# Patient Record
Sex: Female | Born: 1965
Health system: Southern US, Community
[De-identification: ages and names within clinical notes are randomized; demographics above are authoritative.]

## PROBLEM LIST (undated history)

## (undated) DIAGNOSIS — F329 Major depressive disorder, single episode, unspecified: Secondary | ICD-10-CM

## (undated) DIAGNOSIS — K219 Gastro-esophageal reflux disease without esophagitis: Secondary | ICD-10-CM

## (undated) DIAGNOSIS — I10 Essential (primary) hypertension: Secondary | ICD-10-CM

## (undated) DIAGNOSIS — F32A Depression, unspecified: Secondary | ICD-10-CM

## (undated) DIAGNOSIS — E669 Obesity, unspecified: Secondary | ICD-10-CM

## (undated) DIAGNOSIS — G4730 Sleep apnea, unspecified: Secondary | ICD-10-CM

## (undated) DIAGNOSIS — K5900 Constipation, unspecified: Secondary | ICD-10-CM

## (undated) DIAGNOSIS — F419 Anxiety disorder, unspecified: Secondary | ICD-10-CM

## (undated) HISTORY — DX: Anxiety disorder, unspecified: F41.9

## (undated) HISTORY — DX: Major depressive disorder, single episode, unspecified: F32.9

## (undated) HISTORY — DX: Constipation, unspecified: K59.00

## (undated) HISTORY — DX: Depression, unspecified: F32.A

## (undated) HISTORY — DX: Gastro-esophageal reflux disease without esophagitis: K21.9

## (undated) HISTORY — DX: Obesity, unspecified: E66.9

---

## 2000-04-03 ENCOUNTER — Ambulatory Visit (HOSPITAL_COMMUNITY): Admission: RE | Admit: 2000-04-03 | Discharge: 2000-04-03 | Payer: Self-pay | Admitting: Family Medicine

## 2000-10-11 ENCOUNTER — Ambulatory Visit (HOSPITAL_COMMUNITY): Admission: RE | Admit: 2000-10-11 | Discharge: 2000-10-11 | Payer: Self-pay | Admitting: Internal Medicine

## 2000-10-11 ENCOUNTER — Encounter: Payer: Self-pay | Admitting: Internal Medicine

## 2000-10-23 ENCOUNTER — Ambulatory Visit (HOSPITAL_COMMUNITY): Admission: RE | Admit: 2000-10-23 | Discharge: 2000-10-23 | Payer: Self-pay | Admitting: Internal Medicine

## 2001-04-16 HISTORY — PX: ABDOMINAL HYSTERECTOMY: SHX81

## 2002-01-11 ENCOUNTER — Encounter: Payer: Self-pay | Admitting: Emergency Medicine

## 2002-01-11 ENCOUNTER — Encounter: Payer: Self-pay | Admitting: General Surgery

## 2002-01-11 ENCOUNTER — Inpatient Hospital Stay (HOSPITAL_COMMUNITY): Admission: EM | Admit: 2002-01-11 | Discharge: 2002-01-15 | Payer: Self-pay | Admitting: Emergency Medicine

## 2002-01-12 ENCOUNTER — Encounter: Payer: Self-pay | Admitting: General Surgery

## 2002-03-22 ENCOUNTER — Emergency Department (HOSPITAL_COMMUNITY): Admission: EM | Admit: 2002-03-22 | Discharge: 2002-03-22 | Payer: Self-pay | Admitting: Internal Medicine

## 2005-11-12 ENCOUNTER — Inpatient Hospital Stay (HOSPITAL_COMMUNITY): Admission: EM | Admit: 2005-11-12 | Discharge: 2005-11-14 | Payer: Self-pay | Admitting: Emergency Medicine

## 2005-11-12 ENCOUNTER — Ambulatory Visit: Payer: Self-pay | Admitting: Gastroenterology

## 2005-12-27 ENCOUNTER — Ambulatory Visit: Payer: Self-pay | Admitting: Internal Medicine

## 2006-01-16 ENCOUNTER — Ambulatory Visit: Payer: Self-pay | Admitting: Internal Medicine

## 2006-01-16 ENCOUNTER — Ambulatory Visit (HOSPITAL_COMMUNITY): Admission: RE | Admit: 2006-01-16 | Discharge: 2006-01-16 | Payer: Self-pay | Admitting: Internal Medicine

## 2006-01-16 HISTORY — PX: COLONOSCOPY: SHX174

## 2006-03-14 ENCOUNTER — Emergency Department (HOSPITAL_COMMUNITY): Admission: EM | Admit: 2006-03-14 | Discharge: 2006-03-14 | Payer: Self-pay | Admitting: Emergency Medicine

## 2006-12-07 ENCOUNTER — Emergency Department (HOSPITAL_COMMUNITY): Admission: EM | Admit: 2006-12-07 | Discharge: 2006-12-08 | Payer: Self-pay | Admitting: Emergency Medicine

## 2006-12-18 ENCOUNTER — Ambulatory Visit (HOSPITAL_BASED_OUTPATIENT_CLINIC_OR_DEPARTMENT_OTHER): Admission: RE | Admit: 2006-12-18 | Discharge: 2006-12-18 | Payer: Self-pay | Admitting: Otolaryngology

## 2007-03-26 ENCOUNTER — Emergency Department (HOSPITAL_COMMUNITY): Admission: EM | Admit: 2007-03-26 | Discharge: 2007-03-26 | Payer: Self-pay | Admitting: Family Medicine

## 2009-02-27 ENCOUNTER — Emergency Department (HOSPITAL_COMMUNITY): Admission: EM | Admit: 2009-02-27 | Discharge: 2009-02-27 | Payer: Self-pay | Admitting: Emergency Medicine

## 2009-08-19 ENCOUNTER — Emergency Department (HOSPITAL_COMMUNITY): Admission: EM | Admit: 2009-08-19 | Discharge: 2009-08-19 | Payer: Self-pay | Admitting: Emergency Medicine

## 2009-10-10 ENCOUNTER — Observation Stay (HOSPITAL_COMMUNITY): Admission: EM | Admit: 2009-10-10 | Discharge: 2009-10-11 | Payer: Self-pay | Admitting: Emergency Medicine

## 2009-10-13 ENCOUNTER — Emergency Department (HOSPITAL_COMMUNITY): Admission: EM | Admit: 2009-10-13 | Discharge: 2009-10-13 | Payer: Self-pay | Admitting: Family Medicine

## 2010-07-02 LAB — URINALYSIS, ROUTINE W REFLEX MICROSCOPIC
Glucose, UA: NEGATIVE mg/dL
Nitrite: NEGATIVE
Protein, ur: NEGATIVE mg/dL
Specific Gravity, Urine: 1.03 (ref 1.005–1.030)
Urobilinogen, UA: 1 mg/dL (ref 0.0–1.0)
pH: 5.5 (ref 5.0–8.0)

## 2010-07-02 LAB — CBC
Hemoglobin: 12.5 g/dL (ref 12.0–15.0)
MCH: 33.3 pg (ref 26.0–34.0)
MCHC: 33.6 g/dL (ref 30.0–36.0)
MCV: 99.3 fL (ref 78.0–100.0)
Platelets: 259 10*3/uL (ref 150–400)
RBC: 3.75 MIL/uL — ABNORMAL LOW (ref 3.87–5.11)
RDW: 13.9 % (ref 11.5–15.5)

## 2010-07-02 LAB — DIFFERENTIAL
Basophils Absolute: 0 10*3/uL (ref 0.0–0.1)
Eosinophils Relative: 2 % (ref 0–5)
Lymphs Abs: 2.6 10*3/uL (ref 0.7–4.0)
Neutrophils Relative %: 60 % (ref 43–77)

## 2010-07-04 LAB — RAPID STREP SCREEN (MED CTR MEBANE ONLY): Streptococcus, Group A Screen (Direct): NEGATIVE

## 2010-07-04 LAB — URINE MICROSCOPIC-ADD ON

## 2010-07-04 LAB — URINALYSIS, ROUTINE W REFLEX MICROSCOPIC
Glucose, UA: NEGATIVE mg/dL
Ketones, ur: 15 mg/dL — AB
Leukocytes, UA: NEGATIVE
Nitrite: POSITIVE — AB
Specific Gravity, Urine: 1.023 (ref 1.005–1.030)
Urobilinogen, UA: 1 mg/dL (ref 0.0–1.0)

## 2010-07-04 LAB — POCT I-STAT, CHEM 8
Calcium, Ion: 1.12 mmol/L (ref 1.12–1.32)
Creatinine, Ser: 0.8 mg/dL (ref 0.4–1.2)
Hemoglobin: 16.3 g/dL — ABNORMAL HIGH (ref 12.0–15.0)

## 2010-07-04 LAB — MONONUCLEOSIS SCREEN: Mono Screen: NEGATIVE

## 2010-07-19 ENCOUNTER — Emergency Department (HOSPITAL_COMMUNITY): Payer: 59

## 2010-07-19 ENCOUNTER — Emergency Department (HOSPITAL_COMMUNITY)
Admission: EM | Admit: 2010-07-19 | Discharge: 2010-07-20 | Disposition: A | Discharge status: Home / Self Care | Payer: 59 | Attending: Emergency Medicine | Admitting: Emergency Medicine

## 2010-07-19 DIAGNOSIS — B9789 Other viral agents as the cause of diseases classified elsewhere: Secondary | ICD-10-CM | POA: Insufficient documentation

## 2010-07-19 DIAGNOSIS — R509 Fever, unspecified: Secondary | ICD-10-CM | POA: Insufficient documentation

## 2010-07-19 DIAGNOSIS — R05 Cough: Secondary | ICD-10-CM | POA: Insufficient documentation

## 2010-07-19 DIAGNOSIS — R059 Cough, unspecified: Secondary | ICD-10-CM | POA: Insufficient documentation

## 2010-07-19 DIAGNOSIS — J029 Acute pharyngitis, unspecified: Secondary | ICD-10-CM | POA: Insufficient documentation

## 2010-07-19 DIAGNOSIS — E860 Dehydration: Secondary | ICD-10-CM | POA: Insufficient documentation

## 2010-07-19 DIAGNOSIS — I10 Essential (primary) hypertension: Secondary | ICD-10-CM | POA: Insufficient documentation

## 2010-07-19 LAB — URINE MICROSCOPIC-ADD ON

## 2010-07-19 LAB — URINALYSIS, ROUTINE W REFLEX MICROSCOPIC
Glucose, UA: NEGATIVE mg/dL
Specific Gravity, Urine: 1.024 (ref 1.005–1.030)
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
## 2010-09-01 NOTE — Discharge Summary (Signed)
NAME:  Leslie Rodriguez, Leslie Rodriguez                           ACCOUNT NO.:  [id number]   MEDICAL RECORD NO.:  [id number]                  PATIENT TYPE:   LOCATION:                                       FACILITY:   PHYSICIAN:  Dirk Dress. Katrinka Blazing, M.D.                DATE OF BIRTH:   DATE OF ADMISSION:  01/11/2002  DATE OF DISCHARGE:  01/15/2002                                 DISCHARGE SUMMARY   DISCHARGE DIAGNOSES:  1. Severe dysfunctional uterine bleeding.  2. Uterine fibroids.  3. Anemia due to dysfunctional uterine bleeding.  4. History of migraine headaches.   SPECIAL PROCEDURE:  Total abdominal hysterectomy with incidental  appendectomy September 30.   DISPOSITION:  The patient was discharged home in stable, satisfactory  condition.  The patient is scheduled to be seen in the office on October 15.   DISCHARGE MEDICATIONS:  1. Phenergan 25 mg every 4 hours as needed for nausea.  2. Tylox 1 or 2 every 4 hours as needed for pain.   SUMMARY:  A 45 year old female with greater than a 10-day history of  abdominal pain, nausea and vomiting.  She had anorexia.  She had no p.r.n.  intake for 10 days.  The pain became more severe on the day prior to  admission.  She became weaker and dizzy.  Pain localized to the right lower  quadrant.  Her history suggested appendicitis so she was admitted for  further studies.  The patient was also noted to have severe anemia, with a  history of hemoglobin being as low as 4 gm%.  This has been present since  1984.  She required transfusion in 1992.  It was thought that her anemia was  due to severe dysfunctional uterine bleeding which had become worse over the  past few years.  She did not respond to iron therapy.  Her periods lasted  greater than 8 or more days per month and she had severe bleeding with  clotting.  She was last transfused in December 2001.  Her only medication  was iron.   PHYSICAL EXAMINATION:  On examination, she looked ill but was in any  acute  distress.  Blood pressure 126/77, pulse 137, respirations 18, temperature  101.8.  Abdomen was distended with tenderness and guarding in the right  lower quadrant and suprapubic area.  She was noted to have a hemoglobin of  8. The patient was transfused 2 units of packed red cells.  She was started  on Zosyn and gentamycin, hydrated, and scheduled for CT of the abdomen.   LABORATORY DATA:  CT of the abdomen showed a normal appendix but her uterus  showed a mass with a central cystic change in the fundus of the uterus which  measured 7.3 x 6.6 cm and was felt to be consistent with a fibroid tumor  with cystic degeneration.  The patient was monitored and treated for a  period her pain consisted.  She had significant tenderness in the suprapubic  and indeed right lower quadrant.  Post transfusion hemoglobin was 11.4.  Urine pregnancy test was negative.   Because the patient had a suspected infarcted or hemorrhagic fibroid with  severe anemia due to her leiomyoma, it was decided to proceed with a  hysterectomy.  This was done on September 30.  At the patient's request, an  incidental appendectomy was done.  The appendix was normal.  The patient did  well in the postoperative period.  She was monitored for 2 days, had no  complaints.  She tolerated diet.  She had return of bowel function, and she  was discharged home in satisfactory condition on October 2.                                               Dirk Dress. Katrinka Blazing, M.D.    LCS/MEDQ  D:  04/05/2002  T:  04/06/2002  Job:  [id number]

## 2010-09-01 NOTE — H&P (Signed)
NAME:  Leslie Rodriguez, Leslie Rodriguez               ACCOUNT NO.:  [id number]   MEDICAL RECORD NO.:  [id number]          PATIENT TYPE:  INP   LOCATION:  A309                          FACILITY:  [facility]   PHYSICIAN:  Osvaldo Shipper, MD     DATE OF BIRTH:  [birth year]   DATE OF ADMISSION:  11/11/2005  DATE OF DISCHARGE:  LH                                HISTORY & PHYSICAL   PRIMARY CARE PHYSICIAN:  Dr. Pecola Leisure, [place].   ADMISSION DIAGNOSES:  1.  Acute diverticulitis.  2.  Right ovarian cyst of unclear etiology.   CHIEF COMPLAINT:  Abdominal pain for two days.   HISTORY OF PRESENT ILLNESS:  The patient is a 45 year old African-American  female who has really no other medical problems who presented to the ED with  two day history of abdominal pain.  She said she was fine until yesterday  morning when she developed cramping in her abdomen.  This progressed on and  got worse and today she could not tolerate the pain and decided to come into  the ED.  She was having some chills at home but no fever, some hot flashes  at home.  Pain is located in the abdomen.  She said diffusely with more so  in the lower abdomen, right and left side.  Pain at times was 10/10 in  intensity.  This was associated with nausea and two episodes of emesis.  No  history of blood in the emesis.  She had a bowel movement earlier today  which was normal, but she did not feel any better after she had that BM.  No  history of diarrhea, no history of dysuria.  No history of any aggravating  or relieving factors.  No history of radiation of pain into any other site.  She has never had such symptoms in the past.  She has not experienced any  weight loss.   MEDICATIONS:  She uses no medications at this time.   ALLERGIES:  NO KNOWN DRUG ALLERGIES.  SHE HAS TOLERATED ANTIBIOTICS IN THE  PAST.   PAST MEDICAL HISTORY:  She gives a history of anemia prior to her  hysterectomy for uterine fibroids.  She also had an appendectomy along  with  that surgery.   SOCIAL HISTORY:  Lives with her son.  No smoking, use of alcohol, or any  illicit drug use.  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  She is independent with her ADLs.   FAMILY HISTORY:  Unremarkable.  No history of any cancer in the family.   REVIEW OF SYSTEMS:  Unremarkable, except as mentioned in the HPI.   PHYSICAL EXAMINATION:  VITAL SIGNS:  Temperature 97.6.  Blood pressure  107/63.  Heart rate initially was 118, currently about 80s.  Respiratory  rate 18.  Saturation not recorded.  GENERAL:  Overweight woman in mild discomfort.  HEENT:  No pallor, no icterus.  Oropharynx was slightly dry, no oral  lesions.  NECK:  Soft, supple, no thyromegaly is appreciated.  LUNGS:  Clear to auscultation bilaterally.  CARDIOVASCULAR:  S1.  S2  is split in the pulmonic area.  Otherwise, no  murmurs appreciated.  ABDOMEN:  Nondistended.  Bowel sounds are not heard at this time. There is  diffuse tenderness, but mostly in the lower quadrants of the abdomen, more  so on the left side.  There is no rebound, rigidity.  Mild guarding is  present.  No mass or organomegaly is appreciated.  EXTREMITIES:  Without edema.  Peripheral pulses are palpable.  NEUROLOGICAL:  The patient is alert and oriented x3.   LABORATORY DATA:  White count is 12.1 with mild neutrophilia.  Hemoglobin is  14.4, platelet count is 342.  Sodium 133, BUN is 5, otherwise CMET is  unremarkable.  Lipase is 25.  UA shows trace ketones and small blood, many  squamous epithelial cells and few bacteria.   IMAGING STUDIES:  The patient had acute abdominal series which was  unremarkable, then she subsequently had a CT abdomen with contrast, which  showed small hiatal hernia, but otherwise unremarkable.  CT pelvis showed  focal area of wall thickening associated with pericolonic edema in large  diverticulum and these findings were compatible with diverticulitis in the  sigmoid region.  A trace  amount of extraluminal gas was noted.  Followup was  recommended to rule out neoplastic process.  A 5.5 cm cystic lesion in the  right hemipelvis was also noted, possibly representing right ovarian cyst.  It was not thought that this was an abscess.  An ultrasound was recommended.   IMPRESSION:  This is a 45 year old African-American female who presents with  abdominal pain and was found to have possibly diverticulitis based on her CT  scan.  There is also a right ovarian lesion that needs to be further  evaluated.   PLAN:  1.  Abdominal pain, likely secondary to diverticulitis.  The patient was      being given Cipro, when she developed severe burning sensation on her      skin, became erythematous with mild hive formation.  I think this      patient will need to be started on Zosyn.  GI consult will be ordered.      She will be kept n.p.o..  Antiemetics and narcotic analgesic agents will      be provided as well.  2.  Possible right ovarian lesion.  Pelvic ultrasound will be ordered to      further evaluate this lesion.  A GYN consultation will be obtained.  The      patient is status post hysterectomy.  3.  DVT/GI prophylaxis was initiated.   Further management decision will be based on results of initial testing and  patient's response to treatment.      Osvaldo Shipper, MD  Electronically Signed     GK/MEDQ  D:  11/12/2005  T:  11/12/2005  Job:  [id number]   cc:   Tilda Burrow, M.D.  Fax: [phone]   Betti D. Pecola Leisure, M.D.  Fax: [phone]

## 2010-09-01 NOTE — Consult Note (Signed)
NAME:  Leslie Rodriguez, Leslie Rodriguez               ACCOUNT NO.:  [id number]   MEDICAL RECORD NO.:  [id number]          PATIENT TYPE:  INP   LOCATION:  A309                          FACILITY:  [facility]   PHYSICIAN:  R. Roetta Sessions, M.D. DATE OF BIRTH:  1965-08-10   DATE OF CONSULTATION:  DATE OF DISCHARGE:                                   CONSULTATION   DATE OF CONSULTATION:  November 12, 2005   REASON FOR CONSULTATION:  Diverticulitis.   HISTORY OF PRESENT ILLNESS:  The patient is a 45 year old African-American  female with a 24-48 hour history of acute onset lower abdominal pain.  The  pain began  Saturday morning and gradually worsened throughout the day.  By  Sunday it was excruciating.  She noted the pain in the suprapubic region.  She had a bowel movement on Saturday and Sunday which provided only  temporary relief of the pain.  She denies any history of constipation,  diarrhea, melena or rectal bleeding.  She had some nausea but no vomiting.  She described hot and cold flashes, but no documented fever.  Denies any  dysuria, hematuria.  She never had anything like this before. No one else  around her has had similar illness.  She denies any recent antibiotic use.  She had a CT of the abdomen and pelvis which revealed a left sacral area  wall thickening associated with pericolonic edema and a large diverticulum  in the sigmoid colon. There was possible trace amount of extraluminal gas.  There was a small amount of free pelvic fluid.  There was a 5.5 cm cystic  lesion in the right hemipelvis in close proximity to the sigmoid colon, but  felt to be probably a right ovarian cyst.  White count was 12,100 on  admission and was up to 13,700.   MEDICATIONS:  Medications at home rarely uses Tylenol or Advil p.r.n.  headache.   ALLERGIES:  Never had any problems with medications, however, when she was  in the emergency department she received IV Cipro and developed swelling of  the thigh and this was  discontinued.   PAST MEDICAL HISTORY:  History of anemia prior to hysterectomy for uterine  fibroids in 2003.  She had an incidental appendectomy at that time.   SOCIAL HISTORY:  She lives with her son.  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  Denies any tobacco, alcohol or drug  use.   FAMILY HISTORY:  Negative for chronic GI illnesses or colorectal cancer.   REVIEW OF SYSTEMS:  See HPI.  For GI, GU, cardiopulmonary, no chest pain or  shortness of breath.   PHYSICAL EXAMINATION:  VITAL SIGNS:  Temperature is 97.2, pulse is 75,  respirations are 18.  Blood pressure is 101/68.  GENERAL:  In general this is a pleasant moderately obese African-American  female in no acute distress.  SKIN:  Skin is warm and dry, no jaundice.  HEENT:  Sclerae nonicteric, oropharyngeal moist, no lymphadenopathy or  thyromegaly.  CHEST:  The lungs are clear to auscultation.  CARDIAC:  Regular rate  and rhythm, normal S1 and S2, no murmurs, rubs, or  gallops.  ABDOMEN:  Positive bowel sounds, obese, but symmetrical, soft.  She has  moderate tenderness throughout the entire lower abdomen specifically moreso  in the suprapubic region to deep palpation.  No rebound tenderness.  She  does have guarding.  No organomegaly or masses appreciated.  EXTREMITIES:  No edema.   LABORATORY DATA:  White count 13,700, hemoglobin 12.4, hematocrit 36.5,  platelets 301,000, INR of 1, sodium 135, potassium 4, BUN 8, creatinine 0.8,  glucose 120, total bilirubin 0.9, alkaline phosphatase 63, AST 14, ALT 14,  albumin 3.7, lipase 25, urinalysis small amount of blood and trace ketones.  X-rays as outlined above.   IMPRESSION:  This patient is a 45 year old African American female with  acute onset of lower abdominal pain with CT findings suggestive of  diverticulitis and possible extraluminal gas noted.  She also has a right  ovarian cyst.  Also has a probable right ovarian cyst.    RECOMMENDATIONS:  1.  Continue Zosyn.  2.  Review films.  3.  She will need to have an endoscopic evaluation of her colon to further      evaluate CT findings (exclude cancer) at a later date assuming that she      continues to improve in the next 24-48 hours.   I would thank [facility] team for allowing [place] to participate in the care of  this patient.      Tana Coast, P.AJonathon Bellows, M.D.  Electronically Signed    LL/MEDQ  D:  11/12/2005  T:  11/12/2005  Job:  [id number]   cc:   Jocelyn Lamer D. Pecola Leisure, M.D.  Fax: [phone]

## 2010-09-01 NOTE — Op Note (Signed)
NAME:  Leslie Rodriguez, Leslie Rodriguez                         ACCOUNT NO.:  [id number]   MEDICAL RECORD NO.:  [id number]                   PATIENT TYPE:  INP   LOCATION:  A303                                 FACILITY:  [facility]   PHYSICIAN:  Dirk Dress. Katrinka Blazing, M.D.                DATE OF BIRTH:  06/17/1965   DATE OF PROCEDURE:  DATE OF DISCHARGE:                                 OPERATIVE REPORT   PREOPERATIVE DIAGNOSES:  1. Uterine fibroids.  2. Dysfunctional uterine bleeding.  3. Anemia.   POSTOPERATIVE DIAGNOSES:  1. Uterine fibroids.  2. Dysfunctional uterine bleeding.  3. Anemia.   PROCEDURE:  1. Total abdominal hysterectomy.  2. Appendectomy.   SURGEON:  Dirk Dress. Katrinka Blazing, M.D.   INDICATIONS:  The patient is a 45 year old female who presented to the  emergency room with severe abdominal pain, nausea, and vomiting of 10 days  duration.  She had pain localized to the right lower quadrant in the  suprapubic area.  She had fever and leukocytosis.  She was treated  symptomatically and underwent CT of the abdomen which only showed enlarged  uterine fibroids.  The appendix was normal.  The patient improved with the  treatment but continued to have significant tenderness in the suprapubic  area.  Pelvic ultrasound confirmed that she had a very large, uterine  fibroid off the fundus of the uterus.  Because of continued symptoms the  patient was scheduled for hysterectomy.  She was transfused 3 units of  packed red blood cells preoperatively.   DESCRIPTION OF PROCEDURE:  Under general endotracheal anesthesia the  patient's abdomen was prepped and draped in a sterile field.  A Pfannenstiel  incision was made.  Exploration of the abdomen revealed a very large uterus  that extended out of the pelvis up about the umbilical area.  There were  multicystic changes in the ovaries.  There was a small amount of fluid in  the pelvis, but this was clear. The upper abdomen, including the small  bowel, colon,  liver, gallbladder, and stomach were normal to palpation.   The abdomen was packed off.  The round ligaments were doubly tied and  divided without difficulty.  The infundibulopelvic ligament was doubly  clamped at its junction with the uterus, divided and tied with ligature of  #0 Dexon.  This was done bilaterally.  The uterine vessels were doubly  clamped, divided and tied with ligatures of #0 Dexon.  This was continued  down to the apex of the vagina. The apex of the vagina was transected using  curved Heaney clamps, divided and tied with ligatures of #0 Dexon. The  vaginal cuff was then oversewn using running #0 Biosyn.  Hemostasis was  achieved.  The pelvis was reperitonealized using 3-0 Biosyn.  There was less  than 200 cc of blood loss.   The patient had wanted her appendix removed and this was discussed  preoperatively.  It was isolated.  The mesoappendix was dissected, clamped  with a mosquito clamp and divided.  It was tied with #0 Dexon.  A  pursestring suture was placed around the base of the appendix. The appendix  was doubly clamped, divided and the base was tied with #0 Dexon.  The stump  was cauterized, invaginated and the pursestring was tied without difficulty.  The mesoappendix was oversewn over the tied pursestring suture at the base  of the cecum.   Further irrigation was carried out.  Sponge, needle, instrument, and blade  counts were verified as correct x2.  The abdomen was closed using #0 Biosyn  on the fat, muscle, and peritoneum.  The fascia was closed with #0 Prolene.  The subcutaneous tissue was closed with 2-0 Biosyn.  The skin was closed  with staples.  The patient tolerated the procedure well.  She was awakened  from anesthesia uneventfully, transferred to a bed, and taken to the  postanesthetic care unit.                                               Dirk Dress. Katrinka Blazing, M.D.    LCS/MEDQ  D:  01/13/2002  T:  01/13/2002  Job:  [id number]   cc:   Geraldo Pitter, M.D.

## 2010-09-01 NOTE — H&P (Signed)
NAME:  Leslie Rodriguez, MILLIKAN               ACCOUNT NO.:  [id number]   MEDICAL RECORD NO.:  [id number]          PATIENT TYPE:  AMB   LOCATION:                                FACILITY:  [facility]   PHYSICIAN:  R. Roetta Sessions, M.D. DATE OF BIRTH:  June 15, 1965   DATE OF ADMISSION:  12/27/2005  DATE OF DISCHARGE:  LH                                HISTORY & PHYSICAL   CHIEF COMPLAINT:  1. Recent left lower quadrant abdominal pain.  2. Abnormal CT.   Ms. Leslie Rodriguez is a pleasant 45 year old African-American female  hospitalized back at the end of July, 2007, with left upper quadrant  abdominal pain.  CT demonstrated sigmoid wall thickening with pericolonic  edema, large diverticulum in sigmoid colon and question trace amount of  extraluminal gas, a small amount of free fluid.  She was treated for  presumed diverticulitis and she has completely recovered with a course of  antibiotics.  She is having normal bowel habits, no abdominal pain  whatsoever, no melena or rectal bleeding.  There is no family history of  colorectal neoplasia.  She has never had her lower GI tract imaged  previously.   PAST MEDICAL HISTORY:  Significant for:  1. History of anemia.  2. Prior hysterectomy.  3. History of uterine fibroids.  4. Status post incidental appendectomy.  5. Seasonal allergies.   CURRENT MEDICATIONS:  __________ p.r.n.   ALLERGIES:  SWELLING OF THE THIGH WITH IV CIPRO.   FAMILY HISTORY:  Negative for chronic GI or liver illness.   SOCIAL HISTORY:  She lives with her son.  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  No alcohol, tobacco or drug use.   REVIEW OF SYSTEMS:  No odynophagia, dysphagia or left sided reflux symptoms,  nausea or vomiting.  No change in weight.  No chest pain, dyspnea or fever.   PHYSICAL EXAMINATION:  A pleasant 45 year old resting comfortably.  Weight  190, height 5 feet 3 inches, temp 97.7, BP 118/80, pulse 72.  Skin warm and  dry.  HEENT EXAM:  No scleral icterus.  CHEST:  Lungs are clear to auscultation.  CARDIAC EXAM:  Regular rate and rhythm without murmur, gallop, rub.  BREAST EXAM:  Deferred.  ABDOMEN:  Obese, positive bowel sounds.  Soft, nontender.  No appreciable  mass or organomegaly.  EXTREMITY EXAM:  No edema.  RECTAL EXAM:  Deferred to the time of colonoscopy.   IMPRESSION:  Ms. Leslie Rodriguez is a pleasant 45 year old lady who presented  with acute illness back at the end of July this year where there was almost  certainly uncomplicated diverticulitis.  She has completely recovered.  She  really needs to have her lower GI tract evaluated to confirm diverticulosis  and rule out any other associated pathology.  To this end, I have  recommended Ms. Decamp go ahead and have a colonoscopy.  Potential risks,  benefits and alternatives have been reviewed, questions answered, she is  agreeable.  Will make further recommendations once colonoscopy has been  completed.      R.  Roetta Sessions, M.D.  Electronically Signed     RMR/MEDQ  D:  12/27/2005  T:  12/27/2005  Job:  [id number]   cc:   Jocelyn Lamer D. Pecola Leisure, M.D.  Fax: [phone]

## 2010-09-01 NOTE — Discharge Summary (Signed)
NAME:  Leslie Rodriguez, Leslie Rodriguez               ACCOUNT NO.:  [id number]   MEDICAL RECORD NO.:  [id number]          PATIENT TYPE:  INP   LOCATION:  A309                          FACILITY:  [facility]   PHYSICIAN:  Margaretmary Dys, M.D.DATE OF BIRTH:  March 31, 1966   DATE OF ADMISSION:  11/11/2005  DATE OF DISCHARGE:  08/01/2007LH                                 DISCHARGE SUMMARY   DISCHARGE DIAGNOSES:  1.  Acute diverticulitis.  2.  Right ovarian cyst, rule out malignancy. Will need followup with      gynecologist in the next 1-2 months.   DISCHARGE MEDICATIONS:  1.  Augmentin 875 mg p.o. b.i.d.  2.  Vicodin 1-2 tablet q. 6 h p.r.n. for severe abdominal pain.   ACTIVITY:  As tolerated.   DIET:  Regular.   FOLLOW UP:  1.  Follow up with primary care physician in about 3-4 weeks.  2.  Follow up with Dr. Jonathon Bellows, GI, in about 6-8 weeks.  She will      need colonoscopy.  3.  Follow up with OB-GYN.  Will schedule an appointment with Dr. Emelda Fear,      the patient's preferred gynecologist to further evaluate the 1.5 cm      cystic lesion in the right hemipelvis. The patient was informed of the      importance of having this followed up very closely to rule out      possibility of malignancy.   PERTINENT LABORATORY DATA ON ADMISSION:  White count 13,700, hemoglobin  12.4, hematocrit 36.5, platelet count 301.  INR 1. Sodium 135, potassium 4,  BUN 8, creatinine 0.8, glucose 120, total bilirubin 0.9, alkaline  phosphatase 63, AST 14, ALT 14, albumin 3.7.  Lipase 25.  Urinalysis shows  small amount of blood and trace ketones.   HOSPITAL COURSE:  She is a 45 year old African-American female who presented  with a 1 to 2-day history of acute onset of abdominal pain.  Kindly refer to  Dr. Chancy Milroy admission History and Physical dated November 11, 2005.  The  patient complained of pain mostly in the lower abdomen on left and right  side and was 10/10 at this point, associated with nausea and emesis.   The  patient did not see any blood in either emesis or stool.  There was no  radiation of the pain to her back.  She did not have any history of weight  loss.   Evaluation revealed she was hemodynamically stable.  Her abdomen was soft  and was deemed not a surgical abdomen.  She had some generalized diffuse  tenderness mostly in the lower quadrants, left greater than right.  She  subsequently had imaging studies with CT of the abdomen with contrast which  showed a small hiatal hernia.  CT of the pelvis showed focal area wall  thickening associated with a pericolic edema and large diverticulum  compatible with diverticulitis.  She was also found, however, to have a 1.5  cm cystic lesion in the right hemipelvis.  Pelvic ultrasound and vaginal  ultrasound were done which confirmed the presence of this cyst.  It was  difficult to tell if it was malignant or not.  Recommendation, however, for  followup ultrasound in 1-2 months was made.  The patient will follow up with  the gynecologist as soon as possible as indicated in the Followup section  above.   Overall, the patient did fairly well.  On the day of discharge, November 14, 2005, she was doing fairly well and was discharged home.   DISPOSITION:  To home in satisfactory condition.      Margaretmary Dys, M.D.  Electronically Signed     AM/MEDQ  D:  11/14/2005  T:  11/14/2005  Job:  [id number]

## 2010-09-01 NOTE — Op Note (Signed)
NAME:  Leslie Rodriguez, Leslie Rodriguez               ACCOUNT NO.:  [id number]   MEDICAL RECORD NO.:  [id number]          PATIENT TYPE:  AMB   LOCATION:  DAY                           FACILITY:  [facility]   PHYSICIAN:  R. Roetta Sessions, M.D. DATE OF BIRTH:  1966-02-04   DATE OF PROCEDURE:  01/16/2006  DATE OF DISCHARGE:                                 OPERATIVE REPORT   INDICATIONS FOR PROCEDURE:  The patient is a 45 year old lady who was in  hospital recently with presumed diverticulitis. CT demonstrated thickening  of the left colon. Illness has resolved clinically with antibiotics.  She is  here for diagnostic colonoscopy.  This approach has been discussed with the  patient at length.  Potential risks, benefits and alternatives have been  reviewed, questions answered and she is agreeable.  Please see documentation  in the medical record.   PROCEDURE NOTE:  O2 saturation, blood pressure, pulse in this patient were  monitored throughout entire procedure.  Conscious sedation was Versed 5 mg  IV, Demerol 100 grams IV in divided doses. Instrument; Olympus video chip  system.   FINDINGS:  Digital rectal exam revealed no abnormalities.   ENDOSCOPIC FINDINGS:  The prep was good.   Rectum:  Examination rectal mucosa and retroflexed view of the anal verge  revealed no abnormalities.   Colon:  Colonic mucosa was surveyed from rectosigmoid junction through the  left transverse, right colon, appendiceal orifice, ileocecal valve and  cecum.  These structures well seen and photographed for the record.  From  this level scope was slowly withdrawn and all previously mentioned mucosal  surfaces were again seen.  The patient had scattered left-sided and  transverse diverticula. Remaining colon mucosa appeared normal.  The patient  tolerated the procedure well and was reactivated in endoscopy.   IMPRESSION:  Normal rectum.  A left-sided transverse diverticula, remaining  colonic mucosa appeared normal.   RECOMMENDATIONS:  1. Diverticulosis literature provided to [name].  2. Daily fiber supplement. No further GI evaluation warranted unless she      develops new or recurrent symptoms in the future.      Jonathon Bellows, M.D.  Electronically Signed     RMR/MEDQ  D:  01/16/2006  T:  01/17/2006  Job:  [id number]   cc:   Jocelyn Lamer D. Pecola Leisure, M.D.  Fax: [phone]

## 2011-01-26 LAB — POCT HEMOGLOBIN-HEMACUE
Hemoglobin: 13.4
Operator id: 116011

## 2011-02-19 ENCOUNTER — Other Ambulatory Visit: Payer: Self-pay | Admitting: Family Medicine

## 2011-02-19 ENCOUNTER — Ambulatory Visit
Admission: RE | Admit: 2011-02-19 | Discharge: 2011-02-19 | Disposition: A | Discharge status: Home / Self Care | Payer: 59 | Source: Ambulatory Visit | Attending: Family Medicine | Admitting: Family Medicine

## 2011-02-19 DIAGNOSIS — R05 Cough: Secondary | ICD-10-CM

## 2011-07-27 ENCOUNTER — Emergency Department (HOSPITAL_COMMUNITY)
Admission: EM | Admit: 2011-07-27 | Discharge: 2011-07-27 | Disposition: A | Discharge status: Home / Self Care | Payer: 59 | Attending: Emergency Medicine | Admitting: Emergency Medicine

## 2011-07-27 ENCOUNTER — Encounter (HOSPITAL_COMMUNITY): Payer: Self-pay | Admitting: Emergency Medicine

## 2011-07-27 DIAGNOSIS — R040 Epistaxis: Secondary | ICD-10-CM | POA: Insufficient documentation

## 2011-07-27 DIAGNOSIS — R51 Headache: Secondary | ICD-10-CM

## 2011-07-27 DIAGNOSIS — I10 Essential (primary) hypertension: Secondary | ICD-10-CM | POA: Insufficient documentation

## 2011-07-27 DIAGNOSIS — IMO0002 Reserved for concepts with insufficient information to code with codable children: Secondary | ICD-10-CM | POA: Insufficient documentation

## 2011-07-27 DIAGNOSIS — S0990XA Unspecified injury of head, initial encounter: Secondary | ICD-10-CM | POA: Insufficient documentation

## 2011-07-27 HISTORY — DX: Essential (primary) hypertension: I10
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
## 2012-04-10 ENCOUNTER — Ambulatory Visit (HOSPITAL_COMMUNITY)
Admission: RE | Admit: 2012-04-10 | Discharge: 2012-04-10 | Disposition: A | Discharge status: Home / Self Care | Payer: 59 | Attending: Psychiatry | Admitting: Psychiatry

## 2012-04-10 DIAGNOSIS — F329 Major depressive disorder, single episode, unspecified: Secondary | ICD-10-CM | POA: Insufficient documentation

## 2012-04-10 DIAGNOSIS — F3289 Other specified depressive episodes: Secondary | ICD-10-CM | POA: Insufficient documentation

## 2012-04-10 DIAGNOSIS — F1010 Alcohol abuse, uncomplicated: Secondary | ICD-10-CM | POA: Insufficient documentation
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
## 2012-04-15 ENCOUNTER — Encounter (HOSPITAL_COMMUNITY): Payer: Self-pay

## 2012-04-15 ENCOUNTER — Encounter (HOSPITAL_COMMUNITY): Payer: 59 | Attending: Psychiatry | Admitting: Psychiatry

## 2012-04-15 DIAGNOSIS — F331 Major depressive disorder, recurrent, moderate: Secondary | ICD-10-CM | POA: Insufficient documentation

## 2012-04-15 DIAGNOSIS — I10 Essential (primary) hypertension: Secondary | ICD-10-CM | POA: Insufficient documentation

## 2012-04-15 MED ORDER — MIRTAZAPINE 15 MG PO TABS: 15.0000 mg | ORAL_TABLET | Freq: Every day | ORAL | Status: DC

## 2012-04-15 NOTE — Progress Notes (Signed)
Psychiatric Assessment Adult  Patient Identification:  Leslie Rodriguez Date of Evaluation:  04/15/2012 Chief Complaint: Depression and anxiety History of Chief Complaint:  46 year old African American single female was referred to our IOP by her therapist [facility]. 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 Patient states that in October she put out her boyfriend of 7 years and the relationship broke up and she has been drinking a pint of Vodka every day, from October to December 15 . Her other stressors include her 31 your old son , who is disabled after being in a car. Patient's grandson had heart surgery and the father left the mother. Patient feels overwhelmed and has been unable to focus on her work. Has been crying a lot feels depressed and anxious and very irritable.Marland Kitchen Her PCP gave her a prescription of Ambien 10 mg which she states does not help. She was also given a prescription of Effexor patient never started due to insurance difficulties.  HPI Review of Systems Physical Exam  Depressive Symptoms: depressed mood, anhedonia, insomnia, psychomotor retardation, fatigue, feelings of worthlessness/guilt, difficulty concentrating, hopelessness, impaired memory, recurrent thoughts of death, panic attacks, weight loss, decreased appetite,  (Hypo) Manic Symptoms:  None  Anxiety Symptoms: Excessive Worry:  Yes Panic Symptoms:  Yes Agoraphobia:  No Obsessive Compulsive: No  Symptoms: None, Specific Phobias:  No Social Anxiety:  Yes  Psychotic Symptoms:  Hallucinations: No None Delusions:  No Paranoia:  No   Ideas of Reference:  No  PTSD Symptoms: None  Traumatic Brain Injury: No   Past Psychiatric History: Diagnosis: Depression   Hospitalizations:   Outpatient Care: Saw her PCP recently and was started on Ambien 10 mg which she's been taking  and was given a prescription for Effexor which she never filled   Substance Abuse Care:   Self-Mutilation:   Suicidal Attempts:   Violent Behaviors:    Past Medical History:   Past Medical History  Diagnosis Date  . Hypertension    History of Loss of Consciousness:  No Seizure History:  No Cardiac History:  No Allergies:   Allergies  Allergen Reactions  . Ciprofloxacin Hcl Hives and Shortness Of Breath   Current Medications:  Current Outpatient Prescriptions  Medication Sig Dispense Refill  . amLODipine-olmesartan (AZOR) 10-40 MG per tablet Take 1 tablet by mouth daily.      Marland Kitchen ibuprofen (ADVIL,MOTRIN) 200 MG tablet Take 600 mg by mouth every 6 (six) hours as needed. For pain.      . mirtazapine (REMERON) 15 MG tablet Take 1 tablet (15 mg total) by mouth at bedtime.  30 tablet  0    Previous Psychotropic Medications:  Medication Dose   None                        Substance Abuse History in the last 12 months: Substance Age of 1st Use Last Use Amount Specific Type  Nicotine      Alcohol  teenager   2 weeks ago   from October 2 0[address]82 March 30 2012 patient has been drinking a pint of vodka every day    Cannabis      Opiates      Cocaine      Methamphetamines      LSD      Ecstasy      Benzodiazepines  Caffeine      Inhalants      Others:                          Medical Consequences of Substance Abuse: None  Legal Consequences of Substance Abuse: None  Family Consequences of Substance Abuse: None  Blackouts:  No DT's:  No Withdrawal Symptoms:  No None  Social History: Current Place of Residence:  Place of Birth:  Family Members:  Marital Status:  Single Children: 4  Sons: 3  Daughters: 1 Relationships:  Education:  Corporate treasurer Problems/Performance:  Religious Beliefs/Practices:  History of Abuse: emotional (mom) Occupational Experiences; Military History:  None. Legal History: none Hobbies/Interests:   Family History:  No  family history on file.none .  Mental Status Examination/Evaluation: Objective:  Appearance: Casual  Eye Contact::  Good  Speech:  Normal Rate  Volume:  Normal  Mood:  Depressed, and anxious  Affect:  Constricted and Depressed  Thought Process:  Goal Directed, Linear and Logical  Orientation:  Full (Time, Place, and Person)  Thought Content:  Obsessions and Rumination  Suicidal Thoughts:  No  Homicidal Thoughts:  No  Judgement:  Fair  Insight:  Good  Psychomotor Activity:  Normal  Akathisia:  No  Handed:  Right  AIMS (if indicated):  0  Assets:  Communication Skills Desire for Improvement Physical Health Resilience Social Support    Laboratory/X-Ray Psychological Evaluation(s)        Assessment:   Axis I: Anxiety Disorder NOS and Major Depression, single episode Axis II: Deferred Axis III:  Past Medical History  Diagnosis Date  . Hypertension    Axis IV: occupational problems, other psychosocial or environmental problems, problems related to social environment and problems with primary support group Axis V: 51-60 moderate symptoms  AXIS I Anxiety Disorder NOS and Major Depression, single episode  AXIS II Deferred  AXIS III Past Medical History  Diagnosis Date  . Hypertension      AXIS IV economic problems, occupational problems, problems related to social environment and problems with primary support group  AXIS V 51-60 moderate symptoms   Treatment Plan/Recommendations:  Plan of Care: start IOP  Laboratory:  None at this time  Psychotherapy: Group and individual therapy  Medications: DC Ambien and Effexor. Discussed rationale risks benefits options of Remeron and patient gave me her informed consent. Patient will start Remeron 7.5 mg each bedtime for 4 days then increase it to 50 mg each bedtime.  Routine PRN Medications:  Yes  Consultations: None  Safety Concerns:  None  Other:   estimated length of stay 2 weeks    Margit Banda,  MD 12/31/201312:59 PM

## 2012-04-15 NOTE — Progress Notes (Signed)
    Daily Group Progress Note  Program: IOP  Group Time: 9:00-10:30 am  Participation Level: Active  Behavioral Response: Appropriate  Type of Therapy:  Process Group  Summary of Progress: Patient was new to group to group today. She was introduced, appeared attentive and talked about grief and loss pertaining to loss of recent relationship and concerns about friends daughter who is possibly terminal and currently in the hospital.      Group Time: 10:30-12:00 pm  Participation Level:  Active  Behavioral Response: Appropriate  Type of Therapy: Psycho-education Group  Summary of Progress: Patient learned skills of self-care and how to take personal responsibility over changing behaviors to increase wellness and creating a daily routine of scheduled activities.   Carman Ching, LCSW

## 2012-04-15 NOTE — Progress Notes (Signed)
Patient ID: Leslie Rodriguez, female   DOB: 1965/06/23, 46 y.o.   MRN: [id number] D:  This is a 46 year old divorced African American female who was referred by her therapist Drusilla Kanner, LCSW. Pt reports increased depression and decreased sleep with SI.  Pt states she hasn't slept in two days.  Discussed safety options with pt.  She is able to contract for safety.  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  Patient is currently on short-term disability from her work.  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  2)  Relationship Issues:  Patient states that in October 2013 she put her boyfriend of 7 years out and the relationship broke up and she has been drinking a pint of Vodka every day, from October to December 15 .  3) 24 your old son , who is disabled (Radial Nerve Damage) after being in a car in 2006.  He is noncompliant with going to the doctor and taking his medications.  He is drinking a lot.  4) Patient's younger son became a father in July 2013.  Patient's grandson had heart surgery and her son left the mother and has moved back into pt's home.    Patient has been crying a lot feels depressed and anxious and very irritable.Marland Kitchen Her PCP gave her a prescription of Ambien 10 mg which she states does not help. She was also given a prescription of Effexor patient never started due to insurance difficulties.  Childhood:  Born in [place].  Biological mother abandoned patient with a cousin in [place].  Had first son at age 12.  Kids:  58yr son, 46 yr old son, 30 yo son, and 75 yo daughter. Pt completed all forms.  Scored 40 on the burns.  Patient's insurance co authorized 12 visits.  A:  Oriented pt.  Provided pt with orientation folder.  Informed Drusilla Kanner, LCSW of admit.  Encourage support groups.  R:  Pt receptive.

## 2012-04-17 ENCOUNTER — Telehealth (HOSPITAL_COMMUNITY): Payer: Self-pay | Admitting: Psychiatry

## 2012-04-17 ENCOUNTER — Encounter (HOSPITAL_COMMUNITY): Payer: 59 | Attending: Psychiatry

## 2012-04-17 DIAGNOSIS — I10 Essential (primary) hypertension: Secondary | ICD-10-CM | POA: Insufficient documentation

## 2012-04-17 DIAGNOSIS — F331 Major depressive disorder, recurrent, moderate: Secondary | ICD-10-CM | POA: Insufficient documentation

## 2012-04-17 NOTE — Telephone Encounter (Signed)
Pt had arrived this a.m., but left before this writer came to group room.  Pt called this Clinical research associate, very tearful stating that her locks had been changed on her apartment.  According to pt, she had been speaking with the leasing agent and had paid some money last week.  Encouraged pt to call someone to go with her to speak with this agent today.  Instructed pt to Regulatory affairs officer after she meets with the agent.

## 2012-04-18 ENCOUNTER — Telehealth (HOSPITAL_COMMUNITY): Payer: Self-pay | Admitting: Psychiatry

## 2012-04-18 ENCOUNTER — Encounter (HOSPITAL_COMMUNITY): Payer: 59

## 2012-04-18 NOTE — Telephone Encounter (Signed)
Pt phoned stating that she wouldn't be able to attend MH-IOP today due to housing issues.  States she is driving to a friend's home who is willing to give her some financial assistance.  Informed pt to call writer if she can't attend on 04-21-12.

## 2012-04-21 ENCOUNTER — Encounter (HOSPITAL_COMMUNITY): Payer: 59 | Admitting: Psychiatry

## 2012-04-21 DIAGNOSIS — F331 Major depressive disorder, recurrent, moderate: Secondary | ICD-10-CM

## 2012-04-21 NOTE — Progress Notes (Signed)
    Daily Group Progress Note  Program: IOP  Group Time: 9:00-10:30 am   Participation Level: Active  Behavioral Response: Appropriate  Type of Therapy:  Process Group  Summary of Progress: Patient reported high stress today and feeling "confused" about her current living situation. She learned last week Thursday that she was locked out of her apartment for being one month late on her rent. She states she is upset at her landlord and feels "betrayed" by them for not giving her notice of the lock out. She is uncertain if she wants to pay the rent for this month or find a new place to live. She problem solved with the other group members about her two options to determine which option had less stress associated with it, but was still uncertain of her decision when group ended. She continues to feel depressed and overwhelmed and finances are a large part of her current stress.      Group Time: 10:30 am - 12:00 pm   Participation Level:  Active  Behavioral Response: Appropriate  Type of Therapy: Psycho-education Group  Summary of Progress: Patient participated in a group on Grief and Loss that was facilitated by Theda Belfast and identified current losses and affective ways of grieving.   Carman Ching, LCSW

## 2012-04-22 ENCOUNTER — Encounter (HOSPITAL_COMMUNITY): Payer: 59 | Admitting: Psychiatry

## 2012-04-22 NOTE — Progress Notes (Signed)
    Daily Group Progress Note  Program: IOP  Group Time: 9:00-10:30 am   Participation Level: Active  Behavioral Response: Appropriate  Type of Therapy:  Process Group  Summary of Progress: Patient reports still having high depression and anxiety stemming from the uncertainty of where she will live and financial stressors associated with being out of work to treat her depression. Patient states she made the decision to look for a new place to live due to her landlord requesting next months rent in addition to this months rent. Patient states it is helpful to have a place to talk about her stressors and receive support. She reports no reduction in depression symptoms.      Group Time: 10:30 am - 12:00 pm.  Participation Level:  Active  Behavioral Response: Appropriate  Type of Therapy: Psycho-education Group  Summary of Progress: Patient learned about the symptoms of depression and anxiety and how to recognize them to avoid or reduce future relapses.  Carman Ching, LCSW

## 2012-04-23 ENCOUNTER — Encounter (HOSPITAL_COMMUNITY): Payer: 59 | Admitting: Psychiatry

## 2012-04-23 DIAGNOSIS — F331 Major depressive disorder, recurrent, moderate: Secondary | ICD-10-CM

## 2012-04-23 NOTE — Progress Notes (Signed)
Patient ID: Leslie Rodriguez, female   DOB: 1965/07/17, 47 y.o.   MRN: [id number] Patient seen today.  She complained of getting worse.  She endorse poor sleep increased anxiety and depression.  When I asked if she is taking medication, she said that she has not started the medication however she pick up the medication yesterday and like to start tonight.  She's staying with a cousin since her apartment locks are changed due to nonpayment of the rent.  She admitted not feeling comfortable living with family member even though family member are very supportive.  Patient denies any active or passive suicidal thoughts or homicidal thoughts.  She admitted sad mood and tearfulness.  She denies any psychosis, hallucination or paranoid thinking.  Reassurance given and recommended to start Remeron as prescribed.  Explained risks and benefits of medication and recommend to contact us if she does not feel any improvement with the medication.  Patient acknowledged.

## 2012-04-23 NOTE — Progress Notes (Signed)
    Daily Group Progress Note  Program: IOP  Group Time: 9:00-10:30 am   Participation Level: Active  Behavioral Response: Appropriate  Type of Therapy:  Process Group  Summary of Progress: Patient reports high depression and anxiety today and talked about her "stubborness" and refusal to ask for and accept help from others is a big struggle for her and contribtues to her depression and anxeity. She is unsure if she wants to change this behavior but explored how it is a barrier to her wellness.      Group Time: 10:30 am - 12:00 pm   Participation Level:  Active  Behavioral Response: Appropriate  Type of Therapy: Psycho-education Group  Summary of Progress: Patient learned about mental health support groups for continuing support following ending the program.   Carman Ching, LCSW

## 2012-04-24 ENCOUNTER — Encounter (HOSPITAL_COMMUNITY): Payer: 59 | Admitting: Psychiatry

## 2012-04-24 DIAGNOSIS — F331 Major depressive disorder, recurrent, moderate: Secondary | ICD-10-CM

## 2012-04-24 NOTE — Progress Notes (Signed)
    Daily Group Progress Note  Program: IOP  Group Time: 9:00-10:30 am   Participation Level: Active  Behavioral Response: Appropriate  Type of Therapy:  Process Group  Summary of Progress: Patient reports feeling "good" today after hearing she was approved to move into a new apartment. Her depression and anxiety are associated with life stressors and as they subside so do her depression symptoms. She described the relief she feels finding a new place to live but the anxiety associated with moving that she now has to deal with.      Group Time: 10:30 am - 12:00 pm   Participation Level:  Active  Behavioral Response: Appropriate  Type of Therapy: Psycho-education Group  Summary of Progress: Patient participated in a goodbye ceremony for two members ending the group today and practiced skills of having healthy closures.   Carman Ching, LCSW

## 2012-04-25 ENCOUNTER — Encounter (HOSPITAL_COMMUNITY): Payer: 59 | Admitting: Psychiatry

## 2012-04-25 DIAGNOSIS — F331 Major depressive disorder, recurrent, moderate: Secondary | ICD-10-CM

## 2012-04-25 NOTE — Progress Notes (Signed)
    Daily Group Progress Note  Program: IOP  Group Time: 9:00-10:30 am   Participation Level: Minimal  Behavioral Response: Appropriate  Type of Therapy:  Process Group  Summary of Progress: Patient reports high depression again today and appears to struggle with inability to manage difficulty emotions related to life stressors. She is working on Producer, television/film/video the skills of distress tolerance to help her maintain stable mood when life becomes stressful. She is in the process of trying to move her items from her old apartment into her new one.      Group Time: 10:30 am - 12:00 pm   Participation Level:  Active  Behavioral Response: Appropriate  Type of Therapy: Psycho-education Group  Summary of Progress: Patient learned about the importance of planning for daily wellness by creating a daily wellness routine list and identifying self-care items to use in addition to this list when stress increases.  Carman Ching, LCSW

## 2012-04-28 ENCOUNTER — Encounter (HOSPITAL_COMMUNITY): Payer: 59 | Admitting: Psychiatry

## 2012-04-28 DIAGNOSIS — F331 Major depressive disorder, recurrent, moderate: Secondary | ICD-10-CM

## 2012-04-28 NOTE — Progress Notes (Signed)
    Daily Group Progress Note  Program: IOP  Group Time: 9:00-10:30 am   Participation Level: Active  Behavioral Response: Appropriate  Type of Therapy:  Process Group  Summary of Progress: Patient reports moderate depression today associated with the following life stressors; her job and not feeling like she can return, moving into her new apartment and her relationship with her ex-boyfriend and feeling uncertain about how to proceed with that given that he cheated on her repeatedly and took more from the relationship than he gave. Patient is still struggling with allowing others to support her during her time of need and patient gave examples of how she organizes her entire family by scheduling their appointments and making sure they are aware of them. She lacks insight into how her tendency to [facility] for others and undercare for herself is contributing to her depression. Members challenged her on this today and encouraged her to give thought to that behavior.      Group Time: 10:30 am - 12:00 pm   Participation Level:  Active  Behavioral Response: Appropriate  Type of Therapy: Psycho-education Group  Summary of Progress: Patient participated in a group facilitated by Theda Belfast on Grief and Loss and learned healthy was to grieve losses.  Carman Ching, LCSW

## 2012-04-29 ENCOUNTER — Encounter (HOSPITAL_COMMUNITY): Payer: 59 | Admitting: Psychiatry

## 2012-04-29 DIAGNOSIS — F331 Major depressive disorder, recurrent, moderate: Secondary | ICD-10-CM

## 2012-04-29 NOTE — Progress Notes (Signed)
    Daily Group Progress Note  Program: IOP  Group Time: 9:00-10:30 am   Participation Level: Active  Behavioral Response: Appropriate  Type of Therapy:  Process Group  Summary of Progress: Patient was aware today that she tends to have negative, worrisome thinking about things that fuels her anxiety and depression. A friend of hers pointed this out and she said she did not realize how negative she tended to be. She said her goal going forward is to try to focus on the positives in her life and challenge the negative thoughts more.      Group Time: 10:30 am - 12:00 pm   Participation Level:  Active  Behavioral Response: Appropriate  Type of Therapy: Psycho-education Group  Summary of Progress: Patient learned about the CBT skill of reframing negative thoughts to reduce symptoms of depression and anxiety.   Carman Ching, LCSW

## 2012-04-30 ENCOUNTER — Encounter (HOSPITAL_COMMUNITY): Payer: 59 | Admitting: Psychiatry

## 2012-04-30 DIAGNOSIS — F331 Major depressive disorder, recurrent, moderate: Secondary | ICD-10-CM

## 2012-04-30 NOTE — Progress Notes (Signed)
    Daily Group Progress Note  Program: IOP  Group Time: 9:00-10:30 am   Participation Level: Active  Behavioral Response: Appropriate  Type of Therapy:  Process Group  Summary of Progress: Patient reports feeling better today, with less depression and anxiety. She is starting to realize how she has loose boundaries with family and friends and she is taking on more responsibility for others than she can handle and that is contributing to her un wellness.      Group Time: 10:30 am - 12:00 pm   Participation Level:  Active  Behavioral Response: Appropriate  Type of Therapy: Psycho-education Group  Summary of Progress: Patient participated in a skills group on healthy boundary setting and identified personality types that make it difficult to set limits with others and also explored the five boundary areas to explore that may need limit setting to increase emotional wellness.   Carman Ching, LCSW

## 2012-05-01 ENCOUNTER — Encounter (HOSPITAL_COMMUNITY): Payer: 59 | Admitting: Psychiatry

## 2012-05-01 DIAGNOSIS — F331 Major depressive disorder, recurrent, moderate: Secondary | ICD-10-CM

## 2012-05-02 ENCOUNTER — Telehealth (HOSPITAL_COMMUNITY): Payer: Self-pay | Admitting: Psychiatry

## 2012-05-02 ENCOUNTER — Encounter (HOSPITAL_COMMUNITY): Payer: 59

## 2012-05-02 NOTE — Progress Notes (Signed)
    Daily Group Progress Note  Program: IOP  Group Time: 9:00-10:30 am   Participation Level: Active  Behavioral Response: Appropriate  Type of Therapy:  Process Group  Summary of Progress: Patient was very quiet today in group. She showed the group that she was down today with non verbal check in. However, when asked why she was down she did not want to talk about it and only stated that she is overwhelmed with everything she has to get done today.      Group Time: 10:30 am - 12:00 pm   Participation Level:  Active  Behavioral Response: Appropriate  Type of Therapy: Psycho-education Group  Summary of Progress: Patient learned a stress management tool (heartmath) and how to use it to reduce stress and maintain wellness.  Carman Ching, LCSW

## 2012-05-05 ENCOUNTER — Encounter (HOSPITAL_COMMUNITY): Payer: 59

## 2012-05-05 ENCOUNTER — Telehealth (HOSPITAL_COMMUNITY): Payer: Self-pay | Admitting: Psychiatry

## 2012-05-06 ENCOUNTER — Encounter (HOSPITAL_COMMUNITY): Payer: 59 | Admitting: Psychiatry

## 2012-05-06 DIAGNOSIS — F331 Major depressive disorder, recurrent, moderate: Secondary | ICD-10-CM

## 2012-05-06 NOTE — Progress Notes (Signed)
Patient ID: Leslie Rodriguez, female   DOB: [birth year], 47 y.o.   MRN: [id number] D:  This is a 47 year old divorced African American female who was referred by her therapist Drusilla Kanner, LCSW. Pt reports increased depression and decreased sleep with SI. Pt states she hasn't slept in two days. Discussed safety options with pt. She is able to contract for safety. 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 Patient is currently on short-term disability from her work. 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  2) Relationship Issues: Patient states that in October 2013 she put her boyfriend of 7 years out and the relationship broke up and she has been drinking a pint of Vodka every day, from October to December 15 . 3) 24 your old son , who is disabled (Radial Nerve Damage) after being in a car in 2006. He is noncompliant with going to the doctor and taking his medications. He is drinking a lot. 4) Patient's younger son became a father in July 2013. Patient's grandson had heart surgery and her son left the mother and has moved back into pt's home.  Patient has been crying a lot feels depressed and anxious and very irritable.Marland Kitchen Her PCP gave her a prescription of Ambien 10 mg which she states does not help. She was also given a prescription of Effexor patient never started due to insurance difficulties.  Pt completed MH-IOP today.  Reports improved sleep and appetite.  Continue to struggle with poor concentration and racing thoughts.  Denies any SI/HI or A/V hallucinations.  A:  D/C today.  Will follow up with Dr. Evelene Croon and Drusilla Kanner, LCSW.  Encouraged support groups. RTW on 05-28-12, per Dr. Evelene Croon.   R:  Pt receptive.

## 2012-05-06 NOTE — Progress Notes (Signed)
    Daily Group Progress Note  Program: IOP  Group Time: 9:00-10:30 am   Participation Level: Active  Behavioral Response: Appropriate  Type of Therapy:  Process Group  Summary of Progress: Today is patients last day in the group. She is smiling and members commented on the significant improvement in mood. Patient states she feels much less overwhelmed and depressed and said she moved into her new apartment yesterday. She expressed a sense of relief at having that mostly behind her. She also said she set some healthy limits with her daughter this morning and is realizing the need for better boundaries in her life to maintain wellness. She is exploring how to set structure with her at home job to also reduce stress in that area. She said she has minimal depression today and feels ready to end the group.      Group Time: 10:30 am - 12:00 pm   Participation Level:  Active  Behavioral Response: Appropriate  Type of Therapy: Psycho-education Group  Summary of Progress: Patient learned the medical symptoms of depression and bipolar disorder to be able to better identify in case of returning or diminishing symptoms.   Carman Ching, LCSW

## 2012-05-06 NOTE — Patient Instructions (Signed)
Pt completed MH-IOP today.  Follow up with Dr. Evelene Croon during the week of 05-12-12.  Pt will call Drusilla Kanner, LCSW for an appointment.  Encouraged support groups.

## 2012-05-06 NOTE — Progress Notes (Signed)
Physician Discharge Summary  Patient ID: Leslie Rodriguez MRN: [id number] DOB/AGE: 1965-05-21 47 y.o.  Admit date: (Not on file) Discharge date: 05/06/2012  Hospital Course: Patient is 47 year old African American female who was admitted to intensive outpatient program due to worsening of the depression.  She was feeling very depressed anxious and tearful.  Chronic stressors 63 year old son who has disability, financial distress and unable to pay the rent.  Patient finished the program.  She started Remeron which helped her sleep anxiety and depression.  She also saw her psychiatrist Dr. Evelene Croon this Friday we increased her Remeron to 30 mg.  Patient is sleeping better.  She is tolerating the medication.  Patient feeling better after the program.  She recently moved to a new place with her son.  She felt increase coping and social skills with the program.  She participated the program.  She denies any current suicidal thoughts or homicidal thoughts.  She will followup with her psychiatrist again on next week.  She will go back to work on May 28 2012.  She denies any recent crying spells irritability agitation or mood swing.  She sleeping better.  She denies any side effects of medication.  Discharge Diagnoses:  Axis I depressive disorder NOS rule out Maj. depression Active Problems:  * No active hospital problems. *        Med List    Warning       Cannot display discharge medications because this is not an admission.        Remeron 30 mg at bedtime Benicar 40 mg daily Amlodipine 10 mg daily  Discharge disposition Patient will followup with her psychiatrist Dr. Evelene Croon, her next appointment is on 05/12/2012, she will also followup with her therapist Timoteo Ace.  Upon discharge patient has no concern about the medication.  She has no suicidal thoughts homicidal thoughts or any psychosis.   Signed: Jahayra Mazo T. 05/06/2012, 9:14 AM

## 2012-05-07 ENCOUNTER — Encounter (HOSPITAL_COMMUNITY): Payer: 59

## 2012-05-08 ENCOUNTER — Encounter (HOSPITAL_COMMUNITY): Payer: 59

## 2012-05-09 ENCOUNTER — Encounter (HOSPITAL_COMMUNITY): Payer: 59

## 2012-05-12 ENCOUNTER — Encounter (HOSPITAL_COMMUNITY): Payer: 59

## 2012-05-13 ENCOUNTER — Encounter (HOSPITAL_COMMUNITY): Payer: 59

## 2012-05-14 ENCOUNTER — Encounter (HOSPITAL_COMMUNITY): Payer: 59

## 2012-05-15 ENCOUNTER — Encounter (HOSPITAL_COMMUNITY): Payer: 59

## 2012-05-16 ENCOUNTER — Encounter (HOSPITAL_COMMUNITY): Payer: 59

## 2012-05-19 ENCOUNTER — Encounter (HOSPITAL_COMMUNITY): Payer: 59

## 2012-05-20 ENCOUNTER — Encounter (HOSPITAL_COMMUNITY): Payer: 59

## 2012-11-27 ENCOUNTER — Emergency Department (HOSPITAL_COMMUNITY)
Admission: EM | Admit: 2012-11-27 | Discharge: 2012-11-28 | Disposition: A | Discharge status: Home / Self Care | Payer: Self-pay | Attending: Emergency Medicine | Admitting: Emergency Medicine

## 2012-11-27 ENCOUNTER — Encounter (HOSPITAL_COMMUNITY): Payer: Self-pay | Admitting: *Deleted

## 2012-11-27 DIAGNOSIS — L089 Local infection of the skin and subcutaneous tissue, unspecified: Secondary | ICD-10-CM | POA: Insufficient documentation

## 2012-11-27 DIAGNOSIS — I10 Essential (primary) hypertension: Secondary | ICD-10-CM | POA: Insufficient documentation

## 2012-11-27 DIAGNOSIS — F411 Generalized anxiety disorder: Secondary | ICD-10-CM | POA: Insufficient documentation

## 2012-11-27 DIAGNOSIS — R238 Other skin changes: Secondary | ICD-10-CM | POA: Insufficient documentation

## 2012-11-27 DIAGNOSIS — L299 Pruritus, unspecified: Secondary | ICD-10-CM | POA: Insufficient documentation

## 2012-11-27 DIAGNOSIS — F329 Major depressive disorder, single episode, unspecified: Secondary | ICD-10-CM | POA: Insufficient documentation

## 2012-11-27 DIAGNOSIS — F3289 Other specified depressive episodes: Secondary | ICD-10-CM | POA: Insufficient documentation

## 2012-11-27 DIAGNOSIS — S20169A Insect bite (nonvenomous) of breast, unspecified breast, initial encounter: Secondary | ICD-10-CM | POA: Insufficient documentation

## 2012-11-27 DIAGNOSIS — T63461A Toxic effect of venom of wasps, accidental (unintentional), initial encounter: Secondary | ICD-10-CM | POA: Insufficient documentation

## 2012-11-27 DIAGNOSIS — Y9389 Activity, other specified: Secondary | ICD-10-CM | POA: Insufficient documentation

## 2012-11-27 DIAGNOSIS — Y929 Unspecified place or not applicable: Secondary | ICD-10-CM | POA: Insufficient documentation

## 2012-11-27 NOTE — ED Notes (Signed)
Pt ambulatory to exam room with steady gait.  

## 2012-11-27 NOTE — ED Notes (Signed)
Pt stung to chest on Tuesday by bee; states continues to itch and has redness extending over chest wall; no other symptoms

## 2012-11-28 MED ORDER — CEPHALEXIN 250 MG PO CAPS
250.0000 mg | ORAL_CAPSULE | Freq: Once | ORAL | Status: AC
  Administered 2012-11-28: 250 mg via ORAL
  Filled 2012-11-28: qty 1

## 2012-11-28 MED ORDER — PREDNISONE 20 MG PO TABS: 40.0000 mg | ORAL_TABLET | Freq: Every day | ORAL | Status: DC

## 2012-11-28 MED ORDER — PREDNISONE 20 MG PO TABS
40.0000 mg | ORAL_TABLET | Freq: Once | ORAL | Status: AC
  Administered 2012-11-28: 40 mg via ORAL
  Filled 2012-11-28: qty 2

## 2012-11-28 MED ORDER — CEPHALEXIN 250 MG PO CAPS: 250.0000 mg | ORAL_CAPSULE | Freq: Three times a day (TID) | ORAL | Status: DC

## 2012-11-28 NOTE — ED Provider Notes (Signed)
  CSN: [id number]     Arrival date & time 11/27/12  2322 History     First MD Initiated Contact with Patient 11/27/12 2338     Chief Complaint  Patient presents with  . Insect Bite   (Consider location/radiation/quality/duration/timing/severity/associated sxs/prior Treatment) HPI Comments: Stung by a bee on the anterior chest 4 days ago since has had itching and increased erythema   The history is provided by the patient.    Past Medical History  Diagnosis Date  . Hypertension   . Depression   . Anxiety    History reviewed. No pertinent past surgical history. No family history on file. History  Substance Use Topics  . Smoking status: Never Smoker   . Smokeless tobacco: Not on file  . Alcohol Use: Yes   OB History   Grav Para Term Preterm Abortions TAB SAB Ect Mult Living                 Review of Systems  Constitutional: Negative for fever and chills.  HENT: Negative for sore throat and trouble swallowing.   Respiratory: Negative for shortness of breath.   Skin: Positive for color change and wound.  All other systems reviewed and are negative.    Allergies  Ciprofloxacin hcl  Home Medications   Current Outpatient Rx  Name  Route  Sig  Dispense  Refill  . ibuprofen (ADVIL,MOTRIN) 200 MG tablet   Oral   Take 600 mg by mouth every 6 (six) hours as needed. For pain.         . mirtazapine (REMERON) 15 MG tablet   Oral   Take 1 tablet (15 mg total) by mouth at bedtime.   30 tablet   0   . cephALEXin (KEFLEX) 250 MG capsule   Oral   Take 1 capsule (250 mg total) by mouth 3 (three) times daily.   20 capsule   0   . predniSONE (DELTASONE) 20 MG tablet   Oral   Take 2 tablets (40 mg total) by mouth daily.   10 tablet   0    BP 135/92  Pulse 75  Temp(Src) 98.9 F (37.2 C) (Oral)  Resp 16  SpO2 100% Physical Exam  Nursing note and vitals reviewed. Constitutional: She is oriented to person, place, and time. She appears well-developed and  well-nourished.  HENT:  Head: Normocephalic.  Eyes: Pupils are equal, round, and reactive to light.  Neck: Normal range of motion.  Cardiovascular: Normal rate.   Pulmonary/Chest: Effort normal.  Musculoskeletal: Normal range of motion.  Neurological: She is alert and oriented to person, place, and time.  Skin: There is erythema.  Central area of bite surrounded bu 10 cm erythmea    ED Course   Procedures (including critical care time)  Labs Reviewed - No data to display No results found. 1. Insect bite, nonvenomous, of chest wall, infected, unspecified laterality, initial encounter     MDM  cellulitis  Arman Filter, NP 11/28/12 0015

## 2012-11-28 NOTE — ED Provider Notes (Signed)
Medical screening examination/treatment/procedure(s) were performed by non-physician practitioner and as supervising physician I was immediately available for consultation/collaboration.   Charlina Dwight L Gibson Lad, MD 11/28/12 0759 

## 2012-12-23 ENCOUNTER — Emergency Department (HOSPITAL_COMMUNITY): Payer: Self-pay

## 2012-12-23 ENCOUNTER — Emergency Department (HOSPITAL_COMMUNITY)
Admission: EM | Admit: 2012-12-23 | Discharge: 2012-12-24 | Disposition: A | Discharge status: Home / Self Care | Payer: Self-pay | Attending: Emergency Medicine | Admitting: Emergency Medicine

## 2012-12-23 ENCOUNTER — Encounter (HOSPITAL_COMMUNITY): Payer: Self-pay | Admitting: Emergency Medicine

## 2012-12-23 ENCOUNTER — Emergency Department (HOSPITAL_COMMUNITY): Payer: 59

## 2012-12-23 DIAGNOSIS — R1032 Left lower quadrant pain: Secondary | ICD-10-CM | POA: Insufficient documentation

## 2012-12-23 DIAGNOSIS — R109 Unspecified abdominal pain: Secondary | ICD-10-CM

## 2012-12-23 DIAGNOSIS — K5900 Constipation, unspecified: Secondary | ICD-10-CM | POA: Insufficient documentation

## 2012-12-23 DIAGNOSIS — Z8659 Personal history of other mental and behavioral disorders: Secondary | ICD-10-CM | POA: Insufficient documentation

## 2012-12-23 DIAGNOSIS — Z79899 Other long term (current) drug therapy: Secondary | ICD-10-CM | POA: Insufficient documentation

## 2012-12-23 DIAGNOSIS — N83299 Other ovarian cyst, unspecified side: Secondary | ICD-10-CM

## 2012-12-23 DIAGNOSIS — I10 Essential (primary) hypertension: Secondary | ICD-10-CM | POA: Insufficient documentation

## 2012-12-23 DIAGNOSIS — N83209 Unspecified ovarian cyst, unspecified side: Secondary | ICD-10-CM | POA: Insufficient documentation

## 2012-12-23 DIAGNOSIS — Z90710 Acquired absence of both cervix and uterus: Secondary | ICD-10-CM | POA: Insufficient documentation

## 2012-12-23 DIAGNOSIS — R110 Nausea: Secondary | ICD-10-CM | POA: Insufficient documentation

## 2012-12-23 LAB — CBC WITH DIFFERENTIAL/PLATELET
Eosinophils Absolute: 0.1 10*3/uL (ref 0.0–0.7)
Eosinophils Relative: 1 % (ref 0–5)
Lymphs Abs: 2.5 10*3/uL (ref 0.7–4.0)
MCH: 31.5 pg (ref 26.0–34.0)
MCV: 93.8 fL (ref 78.0–100.0)
Monocytes Absolute: 0.7 10*3/uL (ref 0.1–1.0)
Platelets: 321 10*3/uL (ref 150–400)
RBC: 3.84 MIL/uL — ABNORMAL LOW (ref 3.87–5.11)

## 2012-12-23 LAB — COMPREHENSIVE METABOLIC PANEL
ALT: 8 U/L (ref 0–35)
BUN: 6 mg/dL (ref 6–23)
Calcium: 8.9 mg/dL (ref 8.4–10.5)
Creatinine, Ser: 0.73 mg/dL (ref 0.50–1.10)
GFR calc Af Amer: >90 mL/min (ref >90)
Glucose, Bld: 103 mg/dL — ABNORMAL HIGH (ref 70–99)
Sodium: 135 mEq/L (ref 135–145)
Total Protein: 6.8 g/dL (ref 6.0–8.3)

## 2012-12-23 LAB — LIPASE, BLOOD: Lipase: 16 U/L (ref 11–59)

## 2012-12-23 LAB — URINALYSIS, ROUTINE W REFLEX MICROSCOPIC
Nitrite: NEGATIVE
Protein, ur: NEGATIVE mg/dL
Specific Gravity, Urine: 1.012 (ref 1.005–1.030)
Urobilinogen, UA: 0.2 mg/dL (ref 0.0–1.0)

## 2012-12-23 MED ORDER — HYDROCODONE-ACETAMINOPHEN 5-325 MG PO TABS: 1.0000 | ORAL_TABLET | Freq: Four times a day (QID) | ORAL | Status: DC | PRN

## 2012-12-23 MED ORDER — ONDANSETRON HCL 4 MG/2ML IJ SOLN
4.0000 mg | Freq: Once | INTRAMUSCULAR | Status: AC
  Administered 2012-12-23: 4 mg via INTRAVENOUS
  Filled 2012-12-23: qty 2

## 2012-12-23 MED ORDER — SODIUM CHLORIDE 0.9 % IV SOLN
INTRAVENOUS | Status: DC
  Administered 2012-12-23: 22:00:00 via INTRAVENOUS

## 2012-12-23 MED ORDER — MORPHINE SULFATE 4 MG/ML IJ SOLN
4.0000 mg | Freq: Once | INTRAMUSCULAR | Status: AC
  Administered 2012-12-23: 4 mg via INTRAVENOUS
  Filled 2012-12-23: qty 1

## 2012-12-23 MED ORDER — IOHEXOL 300 MG/ML  SOLN
50.0000 mL | Freq: Once | INTRAMUSCULAR | Status: AC | PRN
  Administered 2012-12-23: 50 mL via ORAL

## 2012-12-23 MED ORDER — ONDANSETRON 4 MG PO TBDP: 4.0000 mg | ORAL_TABLET | Freq: Three times a day (TID) | ORAL | Status: DC | PRN

## 2012-12-23 MED ORDER — IOHEXOL 300 MG/ML  SOLN
100.0000 mL | Freq: Once | INTRAMUSCULAR | Status: AC | PRN
  Administered 2012-12-23: 100 mL via INTRAVENOUS

## 2012-12-23 NOTE — ED Provider Notes (Signed)
CSN: [id number]     Arrival date & time 12/23/12  2007 History   First MD Initiated Contact with Patient 12/23/12 2030     Chief Complaint  Patient presents with  . Abdominal Pain   (Consider location/radiation/quality/duration/timing/severity/associated sxs/prior Treatment) Patient is a 47 y.o. female presenting with abdominal pain. The history is provided by the patient. No language interpreter was used.  Abdominal Pain Pain location:  LLQ and RLQ Pain quality: bloating and gnawing   Pain severity:  Severe Onset quality:  Gradual Duration:  4 days Timing:  Constant Progression:  Worsening Chronicity:  New Context: diet changes   Relieved by:  None tried Worsened by:  Palpation Ineffective treatments:  None tried Associated symptoms: constipation and nausea   Associated symptoms: no diarrhea and no vomiting     Past Medical History  Diagnosis Date  . Hypertension   . Depression   . Anxiety    Past Surgical History  Procedure Laterality Date  . Abdominal hysterectomy  2003   History reviewed. No pertinent family history. History  Substance Use Topics  . Smoking status: Never Smoker   . Smokeless tobacco: Never Used  . Alcohol Use: Yes     Comment: socially   OB History   Grav Para Term Preterm Abortions TAB SAB Ect Mult Living                 Review of Systems  Gastrointestinal: Positive for nausea, abdominal pain and constipation. Negative for vomiting and diarrhea.  All other systems reviewed and are negative.    Allergies  Ciprofloxacin hcl  Home Medications   Current Outpatient Rx  Name  Route  Sig  Dispense  Refill  . cycloSPORINE (RESTASIS) 0.05 % ophthalmic emulsion   Both Eyes   Place 1 drop into both eyes 2 (two) times daily.         . fluorometholone (FML) 0.1 % ophthalmic suspension   Both Eyes   Place 1 drop into both eyes 3 (three) times daily.         Marland Kitchen ibuprofen (ADVIL,MOTRIN) 200 MG tablet   Oral   Take 600 mg by mouth every 6  (six) hours as needed. For pain.          BP 157/96  Pulse 88  Temp(Src) 97.8 F (36.6 C) (Oral)  Resp 16  Ht 5\' 3"  (1.6 m)  Wt 190 lb (86.183 kg)  BMI 33.67 kg/m2  SpO2 100% Physical Exam  Nursing note and vitals reviewed. Constitutional: She is oriented to person, place, and time. She appears well-developed and well-nourished.  HENT:  Head: Normocephalic.  Eyes: Pupils are equal, round, and reactive to light.  Neck: Normal range of motion.  Cardiovascular: Normal rate and regular rhythm.   Pulmonary/Chest: Effort normal and breath sounds normal.  Abdominal: Soft. There is tenderness.  Musculoskeletal: She exhibits no edema and no tenderness.  Neurological: She is alert and oriented to person, place, and time.  Skin: Skin is warm and dry.  Psychiatric: She has a normal mood and affect. Her behavior is normal. Judgment and thought content normal.    ED Course  Procedures (including critical care time) Labs Review Labs Reviewed  CBC WITH DIFFERENTIAL  COMPREHENSIVE METABOLIC PANEL  LIPASE, BLOOD  URINALYSIS, ROUTINE W REFLEX MICROSCOPIC   Imaging Review No results found. Patient with worsening bilateral lower quadrant over the last several days.  Nausea without vomiting, mild constipation.  Prior history of diverticulitis, but no exacerbation for many  years.  Radiology and lab results reviewed, shared with patient. No indication of diverticulitis. Complex right ovarian cyst.  No evidence of torsion.  Discussed with Dr. Radford Pax.  Patient will be discharged home with symptomatic control measures, to follow-up with her gynecologist.  Return precautions discussed with patient. MDM    Abdominal pain. Right ovarian cyst.    Jimmye Norman, NP 12/24/12 [phone]

## 2012-12-23 NOTE — ED Notes (Signed)
Patient assisted to restroom to collect urine sample. 

## 2012-12-23 NOTE — ED Notes (Signed)
Patient transported to CT 

## 2012-12-23 NOTE — ED Notes (Signed)
Pt stated she has a hx of diverticulitis and this feels like similar pain for the past week.  Pt stated she changed her diet last week and began feeling bloated, pain has become unbearable today. Pt reports nausea but denies vomiting or diarrhea.  LBM 9/9 but stated "small amount"

## 2012-12-23 NOTE — ED Notes (Signed)
Pt unable to void at this time. 

## 2012-12-24 NOTE — Progress Notes (Signed)
[facility] ED CM received a voice message left by pt at 1031 12/24/12 stating she did not have insurance coverage and was unable to afford medication d/c on from ED States her pharmacy [facility] cost for zofran and norco was $100 together. Requesting medication change to generic so she can afford medicine cost  CM reviewed EPIC notes -after visit summary for 12/24/12 ED visit to find pt d/c with zofran odt and norco prescriptions   1140 Spoke with Dr Roselyn Bering about pt request for medication change to generic medicine with copy of after visit summary CCM, RN given new orders for medication change  1145CM left a voice message ([phone])  for pt to inquire which [facility] pharmacy is her choice and to inform her that zofran can be changed to phenergan Left CM return office number   Spoke with Fulton Mole and Donnie at the [address] [place], [place] [address] [facility] at [phone] to call in change of medication per Dr Roselyn Bering from Zofran ODT to Phenergan 25 mg po every 8 hrs prn nausea and vomiting #12 with no refill Provided call back number as x [phone]   Pt left CM a voice message at [phone] confirming [facility] pharmacy as her choice 1202 CM left a voice message for pt informing her that the phenergan- generic medication has been called into the [facility] pharmacy to [facility] and she can check with pharmacy for pick up status

## 2012-12-24 NOTE — ED Provider Notes (Signed)
Medical screening examination/treatment/procedure(s) were performed by non-physician practitioner and as supervising physician I was immediately available for consultation/collaboration.    Mahalia Dykes L Luca Burston, MD 12/24/12 1457 

## 2013-04-10 ENCOUNTER — Emergency Department (HOSPITAL_COMMUNITY): Payer: Self-pay

## 2013-04-10 ENCOUNTER — Emergency Department (HOSPITAL_COMMUNITY)
Admission: EM | Admit: 2013-04-10 | Discharge: 2013-04-10 | Disposition: A | Discharge status: Home / Self Care | Payer: Self-pay | Attending: Emergency Medicine | Admitting: Emergency Medicine

## 2013-04-10 ENCOUNTER — Encounter (HOSPITAL_COMMUNITY): Payer: Self-pay | Admitting: Emergency Medicine

## 2013-04-10 DIAGNOSIS — Z8659 Personal history of other mental and behavioral disorders: Secondary | ICD-10-CM | POA: Insufficient documentation

## 2013-04-10 DIAGNOSIS — R071 Chest pain on breathing: Secondary | ICD-10-CM | POA: Insufficient documentation

## 2013-04-10 DIAGNOSIS — I10 Essential (primary) hypertension: Secondary | ICD-10-CM | POA: Insufficient documentation

## 2013-04-10 DIAGNOSIS — J208 Acute bronchitis due to other specified organisms: Secondary | ICD-10-CM | POA: Diagnosis present

## 2013-04-10 DIAGNOSIS — J209 Acute bronchitis, unspecified: Secondary | ICD-10-CM | POA: Insufficient documentation

## 2013-04-10 DIAGNOSIS — R1110 Vomiting, unspecified: Secondary | ICD-10-CM | POA: Insufficient documentation

## 2013-04-10 DIAGNOSIS — R0789 Other chest pain: Secondary | ICD-10-CM | POA: Diagnosis present

## 2013-04-10 LAB — BASIC METABOLIC PANEL
BUN: 9 mg/dL (ref 6–23)
CO2: 25 mEq/L (ref 19–32)
Calcium: 9.2 mg/dL (ref 8.4–10.5)
Chloride: 98 mEq/L (ref 96–112)
Creatinine, Ser: 0.68 mg/dL (ref 0.50–1.10)
GFR calc Af Amer: >90 mL/min (ref >90)
Potassium: 3.7 mEq/L (ref 3.5–5.1)
Sodium: 133 mEq/L — ABNORMAL LOW (ref 135–145)

## 2013-04-10 LAB — TROPONIN I: Troponin I: <0.3 ng/mL (ref <0.30)

## 2013-04-10 LAB — CBC WITH DIFFERENTIAL/PLATELET
Basophils Absolute: 0 10*3/uL (ref 0.0–0.1)
Eosinophils Absolute: 0.1 10*3/uL (ref 0.0–0.7)
Lymphocytes Relative: 33 % (ref 12–46)
Lymphs Abs: 2.4 10*3/uL (ref 0.7–4.0)
Neutrophils Relative %: 58 % (ref 43–77)
Platelets: 394 10*3/uL (ref 150–400)
RBC: 4.12 MIL/uL (ref 3.87–5.11)
RDW: 13.2 % (ref 11.5–15.5)
WBC: 7.3 10*3/uL (ref 4.0–10.5)

## 2013-04-10 MED ORDER — HYDROCHLOROTHIAZIDE 25 MG PO TABS: 25.0000 mg | ORAL_TABLET | Freq: Every day | ORAL | Status: DC

## 2013-04-10 MED ORDER — OXYCODONE-ACETAMINOPHEN 5-325 MG PO TABS
1.0000 | ORAL_TABLET | Freq: Once | ORAL | Status: AC
  Administered 2013-04-10: 1 via ORAL
  Filled 2013-04-10: qty 1

## 2013-04-10 MED ORDER — OXYCODONE-ACETAMINOPHEN 5-325 MG PO TABS: 1.0000 | ORAL_TABLET | Freq: Four times a day (QID) | ORAL | Status: DC | PRN

## 2013-04-10 NOTE — ED Provider Notes (Signed)
CSN: [id number]     Arrival date & time 04/10/13  1251 History   First MD Initiated Contact with Patient 04/10/13 1456     Chief Complaint  Patient presents with  . Chest Pain   (Consider location/radiation/quality/duration/timing/severity/associated sxs/prior Treatment) Patient is a 47 y.o. female presenting with chest pain. The history is provided by the patient.  Chest Pain Pain location:  Substernal area, L chest and R chest Pain quality: aching and burning   Pain radiates to:  L arm and R arm Pain radiates to the back: no   Pain severity:  Moderate Onset quality:  Gradual Duration:  12 hours Timing:  Intermittent Progression:  Unchanged Chronicity:  New Context: not breathing   Relieved by:  Nothing Worsened by:  Nothing tried Ineffective treatments: tylenol. Associated symptoms: cough and vomiting (post-tussive)   Associated symptoms: no abdominal pain, no back pain, no dizziness, no fatigue, no fever, no headache, no nausea and no shortness of breath   Cough:    Cough characteristics:  Productive   Sputum characteristics:  White   Severity:  Mild   Past Medical History  Diagnosis Date  . Hypertension   . Depression   . Anxiety    Past Surgical History  Procedure Laterality Date  . Abdominal hysterectomy  2003   History reviewed. No pertinent family history. History  Substance Use Topics  . Smoking status: Never Smoker   . Smokeless tobacco: Never Used  . Alcohol Use: Yes     Comment: socially   OB History   Grav Para Term Preterm Abortions TAB SAB Ect Mult Living                 Review of Systems  Constitutional: Negative for fever and fatigue.  HENT: Negative for congestion and drooling.   Eyes: Negative for pain.  Respiratory: Positive for cough. Negative for shortness of breath.   Cardiovascular: Positive for chest pain.  Gastrointestinal: Positive for vomiting (post-tussive). Negative for nausea, abdominal pain and diarrhea.  Genitourinary:  Negative for dysuria and hematuria.  Musculoskeletal: Negative for back pain, gait problem and neck pain.  Skin: Negative for color change.  Neurological: Negative for dizziness and headaches.  Hematological: Negative for adenopathy.  Psychiatric/Behavioral: Negative for behavioral problems.  All other systems reviewed and are negative.    Allergies  Ciprofloxacin hcl  Home Medications   Current Outpatient Rx  Name  Route  Sig  Dispense  Refill  . ibuprofen (ADVIL,MOTRIN) 200 MG tablet   Oral   Take 600 mg by mouth every 6 (six) hours as needed. For pain.         . promethazine-dextromethorphan (PROMETHAZINE-DM) 6.25-15 MG/5ML syrup   Oral   Take 5 mLs by mouth 4 (four) times daily as needed for cough.          BP 154/96  Pulse 86  Temp(Src) 97.3 F (36.3 C) (Oral)  Resp 21  SpO2 98% Physical Exam  Nursing note and vitals reviewed. Constitutional: She is oriented to person, place, and time. She appears well-developed and well-nourished.  HENT:  Head: Normocephalic.  Mouth/Throat: Oropharynx is clear and moist. No oropharyngeal exudate.  Eyes: Conjunctivae and EOM are normal. Pupils are equal, round, and reactive to light.  Neck: Normal range of motion. Neck supple.  Cardiovascular: Normal rate, regular rhythm, normal heart sounds and intact distal pulses.  Exam reveals no gallop and no friction rub.   No murmur heard. Pulmonary/Chest: Effort normal and breath sounds normal. No  respiratory distress. She has no wheezes. She exhibits tenderness (left upper chest ttp).  Abdominal: Soft. Bowel sounds are normal. There is no tenderness. There is no rebound and no guarding.  Musculoskeletal: Normal range of motion. She exhibits no edema and no tenderness.  Neurological: She is alert and oriented to person, place, and time.  Skin: Skin is warm and dry.  Psychiatric: She has a normal mood and affect. Her behavior is normal.    ED Course  Procedures (including critical  care time) Labs Review Labs Reviewed  BASIC METABOLIC PANEL - Abnormal; Notable for the following:    Sodium 133 (*)    All other components within normal limits  CBC WITH DIFFERENTIAL  TROPONIN I   Imaging Review Dg Chest 2 View  04/10/2013   CLINICAL DATA:  Chest pain, cough.  EXAM: CHEST  2 VIEW  COMPARISON:  February 19, 2011.  FINDINGS: The heart size and mediastinal contours are within normal limits. Both lungs are clear. No pleural effusion or pneumothorax is noted. The visualized skeletal structures are unremarkable.  IMPRESSION: No active cardiopulmonary disease.   Electronically Signed   By: Roque Lias M.D.   On: 04/10/2013 15:32    EKG Interpretation    Date/Time:  Friday April 10 2013 13:15:10 EST Ventricular Rate:  77 PR Interval:  147 QRS Duration: 89 QT Interval:  411 QTC Calculation: 465 R Axis:   64 Text Interpretation:  Sinus rhythm Baseline wander in lead(s) V2 mild to mod artifact limiting interpretation non-specific T wave changes Confirmed by Leean Amezcua  MD, Chisa Kushner ([id number]) on 04/10/2013 3:25:06 PM            MDM   1. Viral bronchitis   2. Chest wall pain    3:21 PM 47 y.o. female who presents with cough for approximately 2-4 weeks. The patient notes episodes of coughing and occasional posttussive emesis. She had a coughing episode this morning and noted some chest pain and bilateral arm pain for several hours afterwards. She noted another episode this afternoon with pain persisting for several hours. The pain was unrelieved with Tylenol. She is afebrile vital signs are unremarkable here. She has few risk factors for cardiac disease including family history and hypertension. She's not currently taking medicine for her hypertension due to financial reasons. I suspect her pain is related to her cough and not cardiac in nature. She also notes it is exacerbated by coughing. Get screening labs and chest x-ray, Percocet for pain.  6:18 PM: Pt feeling much  better, labs non-contrib. Low risk for MACE per HEART score. Will provide percocet for chest wall pain. Will provide Rx of hctz for BP as she cannot afford the CCB which she was previously on. Will have her f/u w/ her pcp to discuss her BP medicines.  I have discussed the diagnosis/risks/treatment options with the patient and believe the pt to be eligible for discharge home to follow-up with pcp within the next 1-2 weeks. We also discussed returning to the ED immediately if new or worsening sx occur. We discussed the sx which are most concerning (e.g., worsening cp, sob, fever, vomiting) that necessitate immediate return. Any new prescriptions provided to the patient are listed below.  Discharge Medication List as of 04/10/2013  6:22 PM    START taking these medications   Details  hydrochlorothiazide (HYDRODIURIL) 25 MG tablet Take 1 tablet (25 mg total) by mouth daily., Starting 04/10/2013, Until Discontinued, Print    oxyCODONE-acetaminophen (PERCOCET) 5-325 MG per tablet  Take 1 tablet by mouth every 6 (six) hours as needed for moderate pain., Starting 04/10/2013, Until Discontinued, Print         Junius Argyle, MD 04/11/13 1145

## 2013-04-10 NOTE — ED Notes (Signed)
Pt c/o chest pain starting today, radiating to left arm, also c/o SOB, nausea, states sharp pain, when vomiting earlier, pain in chest is gone, continues with arm pain

## 2013-04-10 NOTE — ED Notes (Signed)
Bed: WA03 Expected date:  Expected time:  Means of arrival:  Comments: triage 

## 2013-04-10 NOTE — ED Notes (Addendum)
Per pt has had a cough for 2 weeks. Pt reports intermittent cp with cough since am. Pt describes cp as tingling and tightness with cough. Pt reports tingling down both arms around 0400 this am.

## 2013-04-10 NOTE — ED Notes (Signed)
Attempt to draw blood; unsuccessful. Phlebotomy notified.

## 2013-08-04 ENCOUNTER — Encounter (HOSPITAL_COMMUNITY): Payer: Self-pay | Admitting: Emergency Medicine

## 2013-08-04 ENCOUNTER — Emergency Department (HOSPITAL_COMMUNITY)
Admission: EM | Admit: 2013-08-04 | Discharge: 2013-08-04 | Disposition: A | Discharge status: Home / Self Care | Payer: 59 | Attending: Emergency Medicine | Admitting: Emergency Medicine

## 2013-08-04 DIAGNOSIS — R040 Epistaxis: Secondary | ICD-10-CM | POA: Insufficient documentation

## 2013-08-04 DIAGNOSIS — I10 Essential (primary) hypertension: Secondary | ICD-10-CM | POA: Insufficient documentation

## 2013-08-04 DIAGNOSIS — R000 Tachycardia, unspecified: Secondary | ICD-10-CM | POA: Insufficient documentation

## 2013-08-04 DIAGNOSIS — F411 Generalized anxiety disorder: Secondary | ICD-10-CM | POA: Insufficient documentation

## 2013-08-04 DIAGNOSIS — R42 Dizziness and giddiness: Secondary | ICD-10-CM | POA: Insufficient documentation

## 2013-08-04 DIAGNOSIS — E663 Overweight: Secondary | ICD-10-CM | POA: Insufficient documentation

## 2013-08-04 DIAGNOSIS — I951 Orthostatic hypotension: Secondary | ICD-10-CM

## 2013-08-04 DIAGNOSIS — Z79899 Other long term (current) drug therapy: Secondary | ICD-10-CM | POA: Insufficient documentation

## 2013-08-04 LAB — CBC WITH DIFFERENTIAL/PLATELET
Basophils Absolute: 0 10*3/uL (ref 0.0–0.1)
Basophils Relative: 1 % (ref 0–1)
Eosinophils Absolute: 0.1 10*3/uL (ref 0.0–0.7)
Eosinophils Relative: 1 % (ref 0–5)
HCT: 39.2 % (ref 36.0–46.0)
Hemoglobin: 13.3 g/dL (ref 12.0–15.0)
LYMPHS ABS: 2.1 10*3/uL (ref 0.7–4.0)
LYMPHS PCT: 36 % (ref 12–46)
MCH: 31.6 pg (ref 26.0–34.0)
MCHC: 33.9 g/dL (ref 30.0–36.0)
MCV: 93.1 fL (ref 78.0–100.0)
MONOS PCT: 7 % (ref 3–12)
Monocytes Absolute: 0.4 10*3/uL (ref 0.1–1.0)
NEUTROS PCT: 55 % (ref 43–77)
Neutro Abs: 3.1 10*3/uL (ref 1.7–7.7)
Platelets: 369 10*3/uL (ref 150–400)
RBC: 4.21 MIL/uL (ref 3.87–5.11)
RDW: 13.2 % (ref 11.5–15.5)
WBC: 5.7 10*3/uL (ref 4.0–10.5)

## 2013-08-04 LAB — BASIC METABOLIC PANEL
BUN: 11 mg/dL (ref 6–23)
CHLORIDE: 100 meq/L (ref 96–112)
CO2: 29 meq/L (ref 19–32)
CREATININE: 0.73 mg/dL (ref 0.50–1.10)
Calcium: 9.5 mg/dL (ref 8.4–10.5)
GFR calc non Af Amer: >90 mL/min (ref >90)
Glucose, Bld: 123 mg/dL — ABNORMAL HIGH (ref 70–99)
POTASSIUM: 3.7 meq/L (ref 3.7–5.3)
Sodium: 140 mEq/L (ref 137–147)

## 2013-08-04 LAB — PROTIME-INR
INR: 0.95 (ref 0.00–1.49)
Prothrombin Time: 12.5 seconds (ref 11.6–15.2)

## 2013-08-04 MED ORDER — SODIUM CHLORIDE 0.9 % IV BOLUS (SEPSIS)
1000.0000 mL | Freq: Once | INTRAVENOUS | Status: AC
  Administered 2013-08-04: 1000 mL via INTRAVENOUS

## 2013-08-04 MED ORDER — ONDANSETRON HCL 4 MG/2ML IJ SOLN
4.0000 mg | Freq: Once | INTRAMUSCULAR | Status: AC
  Administered 2013-08-04: 4 mg via INTRAVENOUS
  Filled 2013-08-04: qty 2

## 2013-08-04 MED ORDER — ACETAMINOPHEN 500 MG PO TABS
1000.0000 mg | ORAL_TABLET | Freq: Once | ORAL | Status: AC
  Administered 2013-08-04: 1000 mg via ORAL
  Filled 2013-08-04: qty 2

## 2013-08-04 MED ORDER — CEPHALEXIN 500 MG PO CAPS: 500.0000 mg | ORAL_CAPSULE | Freq: Four times a day (QID) | ORAL | Status: DC

## 2013-08-04 MED ORDER — HYDROCODONE-ACETAMINOPHEN 5-325 MG PO TABS
1.0000 | ORAL_TABLET | Freq: Four times a day (QID) | ORAL | Status: DC | PRN
Start: 2013-08-04 — End: 2015-09-14

## 2013-08-04 MED ORDER — OXYMETAZOLINE HCL 0.05 % NA SOLN
1.0000 | Freq: Once | NASAL | Status: AC
  Administered 2013-08-04: 1 via NASAL
  Filled 2013-08-04: qty 15

## 2013-08-04 NOTE — ED Notes (Signed)
Rapid rhino placed to L nare by Dr. Wilkie AyeHorton.

## 2013-08-04 NOTE — ED Notes (Signed)
Patient A&O x4 with no active bleeding. Nasal packing still clean dry and intact. Pt able to ambulate without c/o dizziness or nausea. Family available to take patient home. No other complaints at this time.

## 2013-08-04 NOTE — Discharge Instructions (Signed)

## 2013-08-04 NOTE — ED Notes (Signed)
Initial contact - pt rec'd from EMS, actively bleeding from bilat nares and L eye, pt also spitting up copious amounts of blood.  Pt reports taking BP meds as prescribed.  Pt denies HA, visual disturbances, weakness, CP/SOB.  +nausea.  A+Ox4.  Skin PWD.  Speaking full/clear sentences, rr even/un-lab.  MAEI.  Pt changed to hospital gown, placed to cardiac/02 monitor and Dr. Wilkie AyeHorton to bedside for eval.

## 2013-08-04 NOTE — ED Notes (Signed)
Pt has no bleeding at this time around nasal packing, c/o discomfort to site but otherwise reports "feeling so much better".  Pt denies needs/complaints at this time.  NAD.

## 2013-08-04 NOTE — ED Provider Notes (Signed)
CSN: [id number]     Arrival date & time 08/04/13  1524 History   First MD Initiated Contact with Patient 08/04/13 1534     Chief Complaint  Patient presents with  . Epistaxis  . Hypertension     (Consider location/radiation/quality/duration/timing/severity/associated sxs/prior Treatment) HPI  This 48 yo female with a history of hypertension who presents with epistaxis. Patient reports onset of brief bleeding from left near approximate 1 hour prior to arrival. Since that time she has also had bleeding from her left eye. Per EMS report, she was hypertensive with palpated blood pressure of 240 systolic. She continues to bleed despite pressure. She reports nosebleeds as a child but denies any recent nosebleeds. She states that she has had worsening seasonal allergies over the last 24 hours including runny nose. She denies any aspirin, Plavix, or Coumadin use. She has not taken her blood pressure medication today. Past Medical History  Diagnosis Date  . Hypertension   . Depression   . Anxiety    Past Surgical History  Procedure Laterality Date  . Abdominal hysterectomy  2003   No family history on file. History  Substance Use Topics  . Smoking status: Never Smoker   . Smokeless tobacco: Never Used  . Alcohol Use: Yes     Comment: socially   OB History   Grav Para Term Preterm Abortions TAB SAB Ect Mult Living                 Review of Systems  HENT: Positive for congestion and nosebleeds. Negative for sinus pressure.   Respiratory: Negative for chest tightness and shortness of breath.   Cardiovascular: Negative for chest pain.  Gastrointestinal: Negative for abdominal pain.  All other systems reviewed and are negative.     Allergies  Ciprofloxacin hcl  Home Medications   Prior to Admission medications   Medication Sig Start Date End Date Taking? Authorizing Provider  acetaminophen (TYLENOL) 500 MG tablet Take 1,000 mg by mouth every 6 (six) hours as needed.   Yes  Historical Provider, MD  diphenhydrAMINE (BENADRYL ALLERGY) 25 mg capsule Take 25 mg by mouth every 6 (six) hours as needed.   Yes Historical Provider, MD  olmesartan (BENICAR) 40 MG tablet Take 40 mg by mouth daily.   Yes Historical Provider, MD   BP 143/96  Pulse 97  Resp 11  SpO2 98% Physical Exam  Nursing note and vitals reviewed. Constitutional: She is oriented to person, place, and time. She appears well-developed and well-nourished.  Overweight, anxious appearing  HENT:  Head: Normocephalic and atraumatic.  Active bleeding from the left near, patient actively coughing up blood clots, bloody tearing noted from the left eye  Eyes: Pupils are equal, round, and reactive to light.  Cardiovascular: Regular rhythm and normal heart sounds.   Tachycardia  Pulmonary/Chest: Effort normal and breath sounds normal. No respiratory distress. She has no wheezes.  Abdominal: Soft. Bowel sounds are normal. There is no tenderness. There is no rebound.  Neurological: She is alert and oriented to person, place, and time.  Skin: Skin is warm and dry.  Psychiatric: She has a normal mood and affect.    ED Course  EPISTAXIS MANAGEMENT Date/Time: 08/04/2013 4:24 PM Performed by: Ross MarcusHORTON, Sergi Gellner, F Authorized by: Ross MarcusHORTON, Belvia Gotschall, F Consent: Verbal consent obtained. Risks and benefits: risks, benefits and alternatives were discussed Consent given by: patient Patient identity confirmed: verbally with patient Local anesthetic: topical anesthetic Anesthetic total: 5 ml Patient sedated: no Treatment site: left anterior  Repair method: silver nitrate and anterior pack Post-procedure assessment: bleeding stopped Treatment complexity: complex Patient tolerance: Patient tolerated the procedure well with no immediate complications.   (including critical care time) Labs Review Labs Reviewed  BASIC METABOLIC PANEL - Abnormal; Notable for the following:    Glucose, Bld 123 (*)    All other components  within normal limits  CBC WITH DIFFERENTIAL  PROTIME-INR    Imaging Review No results found.   EKG Interpretation None      MDM   Final diagnoses:  Epistaxis  Orthostasis    Patient presents with epistaxis. Bleeding noted from the left nare.  Patient required anterior packing to resolve nosebleed. She was noted to have a significant amount of clot in his emesis basin. Hematocrit within normal limits but likely does not reflect acute bleeding.   6:00 PM Epistaxis well controlled with Rhino Rocket placement. Patient does report dizziness upon standing. Patient will given a normal saline bolus orthostatic vital signs obtained. Plan to repeat hematocrit at 4 hours.  7:34 PM  She reports improved symptoms with fluids. She is able to ambulate without dizziness or vital sign abnormalities.  Discuss with patient followup with ENT in 3-5 days for packing removal. We'll place on antibiotics and pain medication.  After history, exam, and medical workup I feel the patient has been appropriately medically screened and is safe for discharge home. Pertinent diagnoses were discussed with the patient. Patient was given return precautions.     Shon Batonourtney F Erinne Gillentine, MD 08/04/13 [phone]

## 2013-08-04 NOTE — ED Notes (Signed)
PER EMS - pt from home with c/o bilateral epistaxis onset 1/2hr PTA in ED.  Pt also reports bleeding from L eye and mouth.  Pt reports takes BP meds at home and is taking as prescribed, none today as pt reports doesn't take meds until the afternoon.  A+OX4.

## 2013-08-04 NOTE — ED Notes (Signed)
Nasal trumpet still clean, dry, intact. No gross bleeding from eyes, nose or mouth. Pt still occasionally has bloody sputum dime sized. Patient A&O x4. VSS. In NAD. Resting comfortably. No other complaints at this time.

## 2013-08-04 NOTE — ED Notes (Signed)
Bed: RU04WA11 Expected date:  Expected time:  Means of arrival:  Comments: BP of 240, bleeding from eyes and nose

## 2013-08-05 ENCOUNTER — Emergency Department (HOSPITAL_COMMUNITY)
Admission: EM | Admit: 2013-08-05 | Discharge: 2013-08-06 | Disposition: A | Discharge status: Home / Self Care | Payer: 59 | Attending: Emergency Medicine | Admitting: Emergency Medicine

## 2013-08-05 ENCOUNTER — Encounter (HOSPITAL_COMMUNITY): Payer: Self-pay | Admitting: Emergency Medicine

## 2013-08-05 DIAGNOSIS — Z8659 Personal history of other mental and behavioral disorders: Secondary | ICD-10-CM | POA: Insufficient documentation

## 2013-08-05 DIAGNOSIS — R519 Headache, unspecified: Secondary | ICD-10-CM

## 2013-08-05 DIAGNOSIS — Z79899 Other long term (current) drug therapy: Secondary | ICD-10-CM | POA: Insufficient documentation

## 2013-08-05 DIAGNOSIS — Z792 Long term (current) use of antibiotics: Secondary | ICD-10-CM | POA: Insufficient documentation

## 2013-08-05 DIAGNOSIS — R110 Nausea: Secondary | ICD-10-CM | POA: Insufficient documentation

## 2013-08-05 DIAGNOSIS — I10 Essential (primary) hypertension: Secondary | ICD-10-CM | POA: Insufficient documentation

## 2013-08-05 DIAGNOSIS — R51 Headache: Secondary | ICD-10-CM | POA: Insufficient documentation

## 2013-08-05 MED ORDER — METOCLOPRAMIDE HCL 5 MG/ML IJ SOLN
10.0000 mg | Freq: Once | INTRAMUSCULAR | Status: AC
  Administered 2013-08-06: 10 mg via INTRAMUSCULAR
  Filled 2013-08-05: qty 2

## 2013-08-05 MED ORDER — KETOROLAC TROMETHAMINE 30 MG/ML IJ SOLN
60.0000 mg | Freq: Once | INTRAMUSCULAR | Status: AC
  Administered 2013-08-06: 60 mg via INTRAMUSCULAR
  Filled 2013-08-05: qty 2

## 2013-08-05 MED ORDER — PSEUDOEPHEDRINE HCL ER 120 MG PO TB12
120.0000 mg | ORAL_TABLET | Freq: Two times a day (BID) | ORAL | Status: DC
  Administered 2013-08-06: 120 mg via ORAL
  Filled 2013-08-05 (×3): qty 1

## 2013-08-05 MED ORDER — DIPHENHYDRAMINE HCL 50 MG/ML IJ SOLN
25.0000 mg | Freq: Once | INTRAMUSCULAR | Status: AC
  Administered 2013-08-06: 25 mg via INTRAMUSCULAR
  Filled 2013-08-05: qty 1

## 2013-08-05 MED ORDER — ANTIPYRINE-BENZOCAINE 5.4-1.4 % OT SOLN
3.0000 [drp] | OTIC | Status: DC | PRN
  Administered 2013-08-06: 4 [drp] via OTIC
  Filled 2013-08-05: qty 10

## 2013-08-05 NOTE — ED Notes (Signed)
Patient arrived from triage Nasal tampon to left nares noted to be in place and intact---no active bleeding or oozing noted Nasal tampon placed in this ED yesterday for c/o epistaxis  Patient here today r/t c/o headache Patient alert and oriented x 4 and in NAD at this time Side rails up, call bell in reach

## 2013-08-05 NOTE — ED Notes (Signed)
Pt presents with nausea and dizziness with HA, neck pain onset today. Pt states she was seen at this facility yesterday, nasal tampon to L nare noted.

## 2013-08-05 NOTE — ED Provider Notes (Signed)
CSN: [id number]     Arrival date & time 08/05/13  1947 History   First MD Initiated Contact with Patient 08/05/13 2302     Chief Complaint  Patient presents with  . Dizziness  . Nausea     (Consider location/radiation/quality/duration/timing/severity/associated sxs/prior Treatment) HPI 48 yo female presents to the emergency department from home with complaint of persistent headache since yesterday, mild nausea, and dizziness.  Patient reports she woke yesterday with a headache.  She thought she had onset of a sinus infection, took Benadryl allergy and sinus and went to sleep.  When she woke, soon after she developed a nosebleed from her left nare.  Patient reports heavy bleeding for some time, and when it began to come out of her left eye, she called [phone].  Patient was seen in emergency department yesterday, had a Rhino Rocket placed.  She reports she was given a prescription for Keflex and Norco.  She reports she's taken 2 doses of the Keflex so far.  She has taken Norco.  She reports the Norco is not helping with her pain.  Pain is mainly over the left eye.  She reports it as a pressure and stabbing type pain.  She has pain in her left ear.  She has decreased hearing.  Patient feels cold, but denies any fevers.  Her blood pressure is much improved from yesterday.  Patient has followup with ENT on Monday for removal of the packing.  Past Medical History  Diagnosis Date  . Hypertension   . Depression   . Anxiety    Past Surgical History  Procedure Laterality Date  . Abdominal hysterectomy  2003   No family history on file. History  Substance Use Topics  . Smoking status: Never Smoker   . Smokeless tobacco: Never Used  . Alcohol Use: Yes     Comment: socially   OB History   Grav Para Term Preterm Abortions TAB SAB Ect Mult Living                 Review of Systems   See History of Present Illness; otherwise all other systems are reviewed and negative  Allergies  Ciprofloxacin  hcl  Home Medications   Prior to Admission medications   Medication Sig Start Date End Date Taking? Authorizing Provider  acetaminophen (TYLENOL) 500 MG tablet Take 1,000 mg by mouth every 6 (six) hours as needed.   Yes Historical Provider, MD  cephALEXin (KEFLEX) 500 MG capsule Take 1 capsule (500 mg total) by mouth 4 (four) times daily. 08/04/13  Yes Shon Batonourtney F Horton, MD  diphenhydrAMINE (BENADRYL ALLERGY) 25 mg capsule Take 25 mg by mouth every 6 (six) hours as needed (allergies).    Yes Historical Provider, MD  HYDROcodone-acetaminophen (NORCO/VICODIN) 5-325 MG per tablet Take 1 tablet by mouth every 6 (six) hours as needed. 08/04/13  Yes Shon Batonourtney F Horton, MD  olmesartan (BENICAR) 40 MG tablet Take 40 mg by mouth daily.   Yes Historical Provider, MD   BP 143/94  Pulse 87  Temp(Src) 98.1 F (36.7 C) (Oral)  Resp 19  Ht 5\' 3"  (1.6 m)  Wt 180 lb (81.647 kg)  BMI 31.89 kg/m2  SpO2 100% Physical Exam  Nursing note and vitals reviewed. Constitutional: She is oriented to person, place, and time. She appears well-developed and well-nourished. She appears distressed (uncomfortable appearing).  HENT:  Head: Normocephalic and atraumatic.  Right Ear: External ear normal.  Nose: Nose normal.  Mouth/Throat: Oropharynx is clear and moist.  Packing in the left nare.  Posterior oropharynx clear.  Left TM without erythema, but retracted and fluid noted behind TM, clear in nature.  Eyes: Conjunctivae and EOM are normal. Pupils are equal, round, and reactive to light.  Neck: Normal range of motion. Neck supple. No JVD present. No tracheal deviation present. No thyromegaly present.  Patient has tenderness to palpation over left trapezius with spasm noted, she has some pain over the posterior cervical muscles as well.  She has a supple neck, full range of motion.  No signs of meningitis  Negative Kernig and Brudzinski  Cardiovascular: Normal rate, regular rhythm, normal heart sounds and intact distal  pulses.  Exam reveals no gallop and no friction rub.   No murmur heard. Pulmonary/Chest: Effort normal and breath sounds normal. No stridor. No respiratory distress. She has no wheezes. She has no rales. She exhibits no tenderness.  Abdominal: Soft. Bowel sounds are normal. She exhibits no distension and no mass. There is no tenderness. There is no rebound and no guarding.  Musculoskeletal: Normal range of motion. She exhibits no edema and no tenderness.  Lymphadenopathy:    She has no cervical adenopathy.  Neurological: She is alert and oriented to person, place, and time. She has normal reflexes. No cranial nerve deficit. She exhibits normal muscle tone. Coordination normal.  Skin: Skin is warm and dry. No rash noted. No erythema. No pallor.  Psychiatric: She has a normal mood and affect. Her behavior is normal. Judgment and thought content normal.    ED Course  Procedures (including critical care time) Labs Review Labs Reviewed - No data to display  Imaging Review No results found.   EKG Interpretation None      MDM   Final diagnoses:  Sinus headache    48 year old female with headache, possible sinus infection, currently on Keflex, no signs of meningitis.  Suspect eustachian tube dysfunction.  Will start on decongestants.  We'll give headache cocktail to see if we can alleviate some of her pain.  Patient to followup as scheduled with ENT on Monday.  1:49 AM Pt feeling much better.  Will d/c home with sudafed, and have her keep appt with ENT  Olivia Mackielga M Katsumi Wisler, MD 08/06/13 [phone]

## 2013-08-06 MED ORDER — ANTIPYRINE-BENZOCAINE 5.4-1.4 % OT SOLN: 3.0000 [drp] | OTIC | Status: DC | PRN

## 2013-08-06 MED ORDER — PSEUDOEPHEDRINE HCL ER 120 MG PO TB12: 120.0000 mg | ORAL_TABLET | Freq: Two times a day (BID) | ORAL | Status: DC

## 2013-08-06 NOTE — Discharge Instructions (Signed)
Sinus Headache °A sinus headache is when your sinuses become clogged or swollen. Sinus headaches can range from mild to severe.  °CAUSES °A sinus headache can have different causes, such as: °· Colds. °· Sinus infections. °· Allergies. °SYMPTOMS  °Symptoms of a sinus headache may vary and can include: °· Headache. °· Pain or pressure in the face. °· Congested or runny nose. °· Fever. °· Inability to smell. °· Pain in upper teeth. °Weather changes can make symptoms worse. °TREATMENT  °The treatment of a sinus headache depends on the cause. °· Sinus pain caused by a sinus infection may be treated with antibiotic medicine. °· Sinus pain caused by allergies may be helped by allergy medicines (antihistamines) and medicated nasal sprays. °· Sinus pain caused by congestion may be helped by flushing the nose and sinuses with saline solution. °HOME CARE INSTRUCTIONS  °· If antibiotics are prescribed, take them as directed. Finish them even if you start to feel better. °· Only take over-the-counter or prescription medicines for pain, discomfort, or fever as directed by your caregiver. °· If you have congestion, use a nasal spray to help reduce pressure. °SEEK IMMEDIATE MEDICAL CARE IF: °· You have a fever. °· You have headaches more than once a week. °· You have sensitivity to light or sound. °· You have repeated nausea and vomiting. °· You have vision problems. °· You have sudden, severe pain in your face or head. °· You have a seizure. °· You are confused. °· Your sinus headaches do not get better after treatment. Many people think they have a sinus headache when they actually have migraines or tension headaches. °MAKE SURE YOU:  °· Understand these instructions. °· Will watch your condition. °· Will get help right away if you are not doing well or get worse. °Document Released: 05/10/2004 Document Revised: 06/25/2011 Document Reviewed: 07/01/2010 °[employer]® Patient Information ©2014 [employer]. ° °

## 2014-07-25 ENCOUNTER — Emergency Department (HOSPITAL_COMMUNITY): Payer: 59

## 2014-07-25 ENCOUNTER — Encounter (HOSPITAL_COMMUNITY): Payer: Self-pay | Admitting: Emergency Medicine

## 2014-07-25 ENCOUNTER — Emergency Department (HOSPITAL_COMMUNITY)
Admission: EM | Admit: 2014-07-25 | Discharge: 2014-07-26 | Disposition: A | Discharge status: Home / Self Care | Payer: 59 | Attending: Emergency Medicine | Admitting: Emergency Medicine

## 2014-07-25 DIAGNOSIS — I10 Essential (primary) hypertension: Secondary | ICD-10-CM | POA: Diagnosis not present

## 2014-07-25 DIAGNOSIS — Z8659 Personal history of other mental and behavioral disorders: Secondary | ICD-10-CM | POA: Diagnosis not present

## 2014-07-25 DIAGNOSIS — R109 Unspecified abdominal pain: Secondary | ICD-10-CM

## 2014-07-25 DIAGNOSIS — Z79899 Other long term (current) drug therapy: Secondary | ICD-10-CM | POA: Insufficient documentation

## 2014-07-25 DIAGNOSIS — K5731 Diverticulosis of large intestine without perforation or abscess with bleeding: Secondary | ICD-10-CM | POA: Diagnosis not present

## 2014-07-25 DIAGNOSIS — Z792 Long term (current) use of antibiotics: Secondary | ICD-10-CM | POA: Insufficient documentation

## 2014-07-25 LAB — CBC WITH DIFFERENTIAL/PLATELET
BASOS ABS: 0 10*3/uL (ref 0.0–0.1)
Basophils Relative: 0 % (ref 0–1)
Eosinophils Absolute: 0.1 10*3/uL (ref 0.0–0.7)
Eosinophils Relative: 1 % (ref 0–5)
HCT: 37.6 % (ref 36.0–46.0)
Hemoglobin: 12.3 g/dL (ref 12.0–15.0)
LYMPHS ABS: 2.9 10*3/uL (ref 0.7–4.0)
Lymphocytes Relative: 50 % — ABNORMAL HIGH (ref 12–46)
MCH: 30.9 pg (ref 26.0–34.0)
MCHC: 32.7 g/dL (ref 30.0–36.0)
MCV: 94.5 fL (ref 78.0–100.0)
Monocytes Absolute: 0.5 10*3/uL (ref 0.1–1.0)
Monocytes Relative: 8 % (ref 3–12)
Neutro Abs: 2.4 10*3/uL (ref 1.7–7.7)
Neutrophils Relative %: 41 % — ABNORMAL LOW (ref 43–77)
Platelets: 332 10*3/uL (ref 150–400)
RBC: 3.98 MIL/uL (ref 3.87–5.11)
RDW: 13.9 % (ref 11.5–15.5)
WBC: 5.9 10*3/uL (ref 4.0–10.5)

## 2014-07-25 LAB — COMPREHENSIVE METABOLIC PANEL
ALT: 18 U/L (ref 0–35)
AST: 17 U/L (ref 0–37)
Albumin: 3.3 g/dL — ABNORMAL LOW (ref 3.5–5.2)
Alkaline Phosphatase: 65 U/L (ref 39–117)
Anion gap: 5 (ref 5–15)
BILIRUBIN TOTAL: 0.5 mg/dL (ref 0.3–1.2)
BUN: 13 mg/dL (ref 6–23)
CALCIUM: 9.1 mg/dL (ref 8.4–10.5)
CHLORIDE: 101 mmol/L (ref 96–112)
CO2: 30 mmol/L (ref 19–32)
Creatinine, Ser: 0.77 mg/dL (ref 0.50–1.10)
GFR calc Af Amer: >90 mL/min (ref >90)
Glucose, Bld: 94 mg/dL (ref 70–99)
Potassium: 4 mmol/L (ref 3.5–5.1)
Sodium: 136 mmol/L (ref 135–145)
Total Protein: 6.6 g/dL (ref 6.0–8.3)

## 2014-07-25 LAB — URINALYSIS, ROUTINE W REFLEX MICROSCOPIC
Bilirubin Urine: NEGATIVE
GLUCOSE, UA: NEGATIVE mg/dL
HGB URINE DIPSTICK: NEGATIVE
Ketones, ur: NEGATIVE mg/dL
Leukocytes, UA: NEGATIVE
Nitrite: NEGATIVE
PH: 5.5 (ref 5.0–8.0)
PROTEIN: NEGATIVE mg/dL
Specific Gravity, Urine: 1.036 — ABNORMAL HIGH (ref 1.005–1.030)
UROBILINOGEN UA: 0.2 mg/dL (ref 0.0–1.0)

## 2014-07-25 LAB — LIPASE, BLOOD: LIPASE: 20 U/L (ref 11–59)

## 2014-07-25 LAB — POC OCCULT BLOOD, ED: FECAL OCCULT BLD: POSITIVE — AB

## 2014-07-25 MED ORDER — IOHEXOL 300 MG/ML  SOLN
100.0000 mL | Freq: Once | INTRAMUSCULAR | Status: AC | PRN
  Administered 2014-07-25: 100 mL via INTRAVENOUS

## 2014-07-25 MED ORDER — HYDROMORPHONE HCL 1 MG/ML IJ SOLN
0.5000 mg | Freq: Once | INTRAMUSCULAR | Status: AC
  Administered 2014-07-25: 0.5 mg via INTRAVENOUS
  Filled 2014-07-25: qty 1

## 2014-07-25 MED ORDER — PANTOPRAZOLE SODIUM 40 MG IV SOLR
40.0000 mg | Freq: Once | INTRAVENOUS | Status: AC
  Administered 2014-07-25: 40 mg via INTRAVENOUS
  Filled 2014-07-25: qty 40

## 2014-07-25 MED ORDER — IOHEXOL 300 MG/ML  SOLN
25.0000 mL | Freq: Once | INTRAMUSCULAR | Status: AC | PRN
  Administered 2014-07-25: 25 mL via ORAL

## 2014-07-25 NOTE — ED Notes (Signed)
Pt reports abdominal bloating and has noticed blood in the toilet when she has a bm for the past 2 days. sts she has not had to strain. But also sts "sometimes I can go, sometimes I can't" denies nvd.

## 2014-07-25 NOTE — ED Provider Notes (Signed)
CSN: [id number]     Arrival date & time 07/25/14  2018 History   First MD Initiated Contact with Patient 07/25/14 2103     Chief Complaint  Patient presents with  . Abdominal Pain     (Consider location/radiation/quality/duration/timing/severity/associated sxs/prior Treatment) Patient is a 49 y.o. female presenting with abdominal pain. The history is provided by the patient.  Abdominal Pain Associated symptoms: no chest pain, no chills, no cough, no diarrhea, no dysuria, no fever, no shortness of breath, no sore throat, no vaginal bleeding, no vaginal discharge and no vomiting   pt c/o right sided abdominal pain for the past 3-4 days. Gradual onset. Constant. Pain worse today. Mod-severe. Pt denies specific exacerbating or alleviating factors, except worse w palpation. Normal appetite. No nv. Had bm today, but noted blood/streaks of blood. No melena. No abd distension. No dysuria, hematuria or gu c/o. No vaginal discharge or bleeding. Only prior abd surgery is hysterectomy. Hx ovarian cysts. Denies hx pud, pancreatitis, no gallstones. +hx single episode diverticulitis in past. No back or flank pain. No fever or chills. No cough or uri c/o. No cp or sob.      Past Medical History  Diagnosis Date  . Hypertension   . Depression   . Anxiety    Past Surgical History  Procedure Laterality Date  . Abdominal hysterectomy  2003   No family history on file. History  Substance Use Topics  . Smoking status: Never Smoker   . Smokeless tobacco: Never Used  . Alcohol Use: Yes     Comment: socially   OB History    No data available     Review of Systems  Constitutional: Negative for fever and chills.  HENT: Negative for sore throat.   Eyes: Negative for redness.  Respiratory: Negative for cough and shortness of breath.   Cardiovascular: Negative for chest pain.  Gastrointestinal: Positive for abdominal pain and blood in stool. Negative for vomiting and diarrhea.  Endocrine: Negative  for polyuria.  Genitourinary: Negative for dysuria, flank pain, vaginal bleeding and vaginal discharge.  Musculoskeletal: Negative for back pain and neck pain.  Skin: Negative for rash.  Neurological: Negative for headaches.  Hematological: Does not bruise/bleed easily.  Psychiatric/Behavioral: Negative for confusion.      Allergies  Ciprofloxacin hcl  Home Medications   Prior to Admission medications   Medication Sig Start Date End Date Taking? Authorizing Provider  acetaminophen (TYLENOL) 500 MG tablet Take 1,000 mg by mouth every 6 (six) hours as needed.    Historical Provider, MD  antipyrine-benzocaine Lyla Son) otic solution Place 3-4 drops into the left ear every 2 (two) hours as needed for ear pain. 08/06/13   Marisa Severin, MD  cephALEXin (KEFLEX) 500 MG capsule Take 1 capsule (500 mg total) by mouth 4 (four) times daily. 08/04/13   Shon Baton, MD  diphenhydrAMINE (BENADRYL ALLERGY) 25 mg capsule Take 25 mg by mouth every 6 (six) hours as needed (allergies).     Historical Provider, MD  HYDROcodone-acetaminophen (NORCO/VICODIN) 5-325 MG per tablet Take 1 tablet by mouth every 6 (six) hours as needed. 08/04/13   Shon Baton, MD  olmesartan (BENICAR) 40 MG tablet Take 40 mg by mouth daily.    Historical Provider, MD  pseudoephedrine (SUDAFED) 120 MG 12 hr tablet Take 1 tablet (120 mg total) by mouth 2 (two) times daily. 08/06/13   Marisa Severin, MD   BP 165/93 mmHg  Pulse 63  Temp(Src) 98.8 F (37.1 C) (Oral)  Resp  17  Ht  (1.6 m)  Wt 204 lb (92.534 kg)  BMI 36.15 kg/m2  SpO2 99% Physical Exam  Constitutional: She appears well-developed and well-nourished. No distress.  HENT:  Mouth/Throat: Oropharynx is clear and moist.  Eyes: Conjunctivae are normal. No scleral icterus.  Neck: Neck supple. No tracheal deviation present.  Cardiovascular: Normal rate, regular rhythm, normal heart sounds and intact distal pulses.   Pulmonary/Chest: Effort normal and breath  sounds normal. No respiratory distress.  Abdominal: Soft. Normal appearance and bowel sounds are normal. She exhibits no distension and no mass. There is tenderness. There is no rebound and no guarding.  Right abd tenderness. No incarc hernia. No rebound or guarding.   Genitourinary:  No cva tenderness. Scant stool/mucous on rectal exam, streak blood - heme pos.   Musculoskeletal: She exhibits no edema.  Neurological: She is alert.  Skin: Skin is warm and dry. No rash noted. She is not diaphoretic.  Psychiatric: She has a normal mood and affect.  Nursing note and vitals reviewed.   ED Course  Procedures (including critical care time) Labs Review  Results for orders placed or performed during the hospital encounter of 07/25/14  CBC with Differential  Result Value Ref Range   WBC 5.9 4.0 - 10.5 K/uL   RBC 3.98 3.87 - 5.11 MIL/uL   Hemoglobin 12.3 12.0 - 15.0 g/dL   HCT 16.1 09.6 - 04.5 %   MCV 94.5 78.0 - 100.0 fL   MCH 30.9 26.0 - 34.0 pg   MCHC 32.7 30.0 - 36.0 g/dL   RDW 40.9 81.1 - 91.4 %   Platelets 332 150 - 400 K/uL   Neutrophils Relative % 41 (L) 43 - 77 %   Neutro Abs 2.4 1.7 - 7.7 K/uL   Lymphocytes Relative 50 (H) 12 - 46 %   Lymphs Abs 2.9 0.7 - 4.0 K/uL   Monocytes Relative 8 3 - 12 %   Monocytes Absolute 0.5 0.1 - 1.0 K/uL   Eosinophils Relative 1 0 - 5 %   Eosinophils Absolute 0.1 0.0 - 0.7 K/uL   Basophils Relative 0 0 - 1 %   Basophils Absolute 0.0 0.0 - 0.1 K/uL  Comprehensive metabolic panel  Result Value Ref Range   Sodium 136 135 - 145 mmol/L   Potassium 4.0 3.5 - 5.1 mmol/L   Chloride 101 96 - 112 mmol/L   CO2 30 19 - 32 mmol/L   Glucose, Bld 94 70 - 99 mg/dL   BUN 13 6 - 23 mg/dL   Creatinine, Ser 7.82 0.50 - 1.10 mg/dL   Calcium 9.1 8.4 - 95.6 mg/dL   Total Protein 6.6 6.0 - 8.3 g/dL   Albumin 3.3 (L) 3.5 - 5.2 g/dL   AST 17 0 - 37 U/L   ALT 18 0 - 35 U/L   Alkaline Phosphatase 65 39 - 117 U/L   Total Bilirubin 0.5 0.3 - 1.2 mg/dL   GFR calc  non Af Amer >90 >90 mL/min   GFR calc Af Amer >90 >90 mL/min   Anion gap 5 5 - 15  Lipase, blood  Result Value Ref Range   Lipase 20 11 - 59 U/L  Urinalysis, Routine w reflex microscopic  Result Value Ref Range   Color, Urine YELLOW YELLOW   APPearance CLEAR CLEAR   Specific Gravity, Urine 1.036 (H) 1.005 - 1.030   pH 5.5 5.0 - 8.0   Glucose, UA NEGATIVE NEGATIVE mg/dL   Hgb urine dipstick NEGATIVE  NEGATIVE   Bilirubin Urine NEGATIVE NEGATIVE   Ketones, ur NEGATIVE NEGATIVE mg/dL   Protein, ur NEGATIVE NEGATIVE mg/dL   Urobilinogen, UA 0.2 0.0 - 1.0 mg/dL   Nitrite NEGATIVE NEGATIVE   Leukocytes, UA NEGATIVE NEGATIVE  POC occult blood, ED Provider will collect  Result Value Ref Range   Fecal Occult Bld POSITIVE (A) NEGATIVE        MDM   Iv ns. Labs.   Dilaudid iv. protonix iv.  Reviewed nursing notes and prior charts for additional history.   Recheck pain improved w meds. Ct pending.  No melena on exam, however stool heme pos.  Recheck ct remains pending.  Ct results at 0043, neg acute.  No recurrent rectal bleeding during ed stay.  hgb normal, bun normal, hr 60's, bp stable.  Pt currently appears stable for d/c.  Will refer to close gi follow up.  Return precautions provided.    Cathren LaineKevin Jayln Madeira, MD 07/26/14 [phone]

## 2014-07-26 NOTE — Discharge Instructions (Signed)
It was our pleasure to provide your ER care today - we hope that you feel better.  For abdominal pain, rectal bleeding, and diverticulosis noted on CT scan, follow up with GI doctor in the coming week - see referral - call office later this morning to arrange follow up appointment.  Also follow up with primary care doctor in the next few days.  Return to ER if worse, new symptoms, recurrent or persistent bleeding, worsening or severe abdominal pain, fevers, weak/faint, other concern.  You were given pain medication in the ER - no driving for the next 4 hours.      Abdominal Pain Many things can cause abdominal pain. Usually, abdominal pain is not caused by a disease and will improve without treatment. It can often be observed and treated at home. Your health care provider will do a physical exam and possibly order blood tests and X-rays to help determine the seriousness of your pain. However, in many cases, more time must pass before a clear cause of the pain can be found. Before that point, your health care provider may not know if you need more testing or further treatment. HOME CARE INSTRUCTIONS  Monitor your abdominal pain for any changes. The following actions may help to alleviate any discomfort you are experiencing:  Only take over-the-counter or prescription medicines as directed by your health care provider.  Do not take laxatives unless directed to do so by your health care provider.  Try a clear liquid diet (broth, tea, or water) as directed by your health care provider. Slowly move to a bland diet as tolerated. SEEK MEDICAL CARE IF:  You have unexplained abdominal pain.  You have abdominal pain associated with nausea or diarrhea.  You have pain when you urinate or have a bowel movement.  You experience abdominal pain that wakes you in the night.  You have abdominal pain that is worsened or improved by eating food.  You have abdominal pain that is worsened with eating  fatty foods.  You have a fever. SEEK IMMEDIATE MEDICAL CARE IF:   Your pain does not go away within 2 hours.  You keep throwing up (vomiting).  Your pain is felt only in portions of the abdomen, such as the right side or the left lower portion of the abdomen.  You pass bloody or black tarry stools. MAKE SURE YOU:  Understand these instructions.   Will watch your condition.   Will get help right away if you are not doing well or get worse.  Document Released: 01/10/2005 Document Revised: 04/07/2013 Document Reviewed: 12/10/2012 [facility] Patient Information 2015 [place], [place]. This information is not intended to replace advice given to you by your health care provider. Make sure you discuss any questions you have with your health care provider.     Diverticulosis Diverticulosis is the condition that develops when small pouches (diverticula) form in the wall of your colon. Your colon, or large intestine, is where water is absorbed and stool is formed. The pouches form when the inside layer of your colon pushes through weak spots in the outer layers of your colon. CAUSES  No one knows exactly what causes diverticulosis. RISK FACTORS  Being older than 50. Your risk for this condition increases with age. Diverticulosis is rare in people younger than 40 years. By age 71, almost everyone has it.  Eating a low-fiber diet.  Being frequently constipated.  Being overweight.  Not getting enough exercise.  Smoking.  Taking over-the-counter pain medicines, like  aspirin and ibuprofen. SYMPTOMS  Most people with diverticulosis do not have symptoms. DIAGNOSIS  Because diverticulosis often has no symptoms, health care providers often discover the condition during an exam for other colon problems. In many cases, a health care provider will diagnose diverticulosis while using a flexible scope to examine the colon (colonoscopy). TREATMENT  If you have never developed an infection  related to diverticulosis, you may not need treatment. If you have had an infection before, treatment may include:  Eating more fruits, vegetables, and grains.  Taking a fiber supplement.  Taking a live bacteria supplement (probiotic).  Taking medicine to relax your colon. HOME CARE INSTRUCTIONS   Drink at least 6-8 glasses of water each day to prevent constipation.  Try not to strain when you have a bowel movement.  Keep all follow-up appointments. If you have had an infection before:  Increase the fiber in your diet as directed by your health care provider or dietitian.  Take a dietary fiber supplement if your health care provider approves.  Only take medicines as directed by your health care provider. SEEK MEDICAL CARE IF:   You have abdominal pain.  You have bloating.  You have cramps.  You have not gone to the bathroom in 3 days. SEEK IMMEDIATE MEDICAL CARE IF:   Your pain gets worse.  Yourbloating becomes very bad.  You have a fever or chills, and your symptoms suddenly get worse.  You begin vomiting.  You have bowel movements that are bloody or black. MAKE SURE YOU:  Understand these instructions.  Will watch your condition.  Will get help right away if you are not doing well or get worse. Document Released: 12/29/2003 Document Revised: 04/07/2013 Document Reviewed: 02/25/2013 [facility] Patient Information 2015 [place], [place]. This information is not intended to replace advice given to you by your health care provider. Make sure you discuss any questions you have with your health care provider.    Rectal Bleeding Rectal bleeding is when blood passes out of the anus. It is usually a sign that something is wrong. It may not be serious, but it should always be evaluated. Rectal bleeding may present as bright red blood or extremely dark stools. The color may range from dark red or maroon to black (like tar). It is important that the cause of rectal  bleeding be identified so treatment can be started and the problem corrected. CAUSES   Hemorrhoids. These are enlarged (dilated) blood vessels or veins in the anal or rectal area.  Fistulas. Theseare abnormal, burrowing channels that usually run from inside the rectum to the skin around the anus. They can bleed.  Anal fissures. This is a tear in the tissue of the anus. Bleeding occurs with bowel movements.  Diverticulosis. This is a condition in which pockets or sacs project from the bowel wall. Occasionally, the sacs can bleed.  Diverticulitis. Thisis an infection involving diverticulosis of the colon.  Proctitis and colitis. These are conditions in which the rectum, colon, or both, can become inflamed and pitted (ulcerated).  Polyps and cancer. Polyps are non-cancerous (benign) growths in the colon that may bleed. Certain types of polyps turn into cancer.  Protrusion of the rectum. Part of the rectum can project from the anus and bleed.  Certain medicines.  Intestinal infections.  Blood vessel abnormalities. HOME CARE INSTRUCTIONS  Eat a high-fiber diet to keep your stool soft.  Limit activity.  Drink enough fluids to keep your urine clear or pale yellow.  Warm baths  may be useful to soothe rectal pain.  Follow up with your caregiver as directed. SEEK IMMEDIATE MEDICAL CARE IF:  You develop increased bleeding.  You have black or dark red stools.  You vomit blood or material that looks like coffee grounds.  You have abdominal pain or tenderness.  You have a fever.  You feel weak, nauseous, or you faint.  You have severe rectal pain or you are unable to have a bowel movement. MAKE SURE YOU:  Understand these instructions.  Will watch your condition.  Will get help right away if you are not doing well or get worse. Document Released: 09/22/2001 Document Revised: 06/25/2011 Document Reviewed: 09/17/2010 [facility] Patient Information 2015 [place]. This  information is not intended to replace advice given to you by your health care provider. Make sure you discuss any questions you have with your health care provider.

## 2014-08-13 ENCOUNTER — Ambulatory Visit: Payer: 59 | Admitting: Gastroenterology

## 2015-07-18 ENCOUNTER — Encounter (HOSPITAL_BASED_OUTPATIENT_CLINIC_OR_DEPARTMENT_OTHER): Payer: Self-pay | Admitting: Radiology

## 2015-07-18 ENCOUNTER — Emergency Department (HOSPITAL_BASED_OUTPATIENT_CLINIC_OR_DEPARTMENT_OTHER): Payer: Commercial Managed Care - HMO

## 2015-07-18 ENCOUNTER — Emergency Department (HOSPITAL_BASED_OUTPATIENT_CLINIC_OR_DEPARTMENT_OTHER)
Admission: EM | Admit: 2015-07-18 | Discharge: 2015-07-18 | Disposition: A | Discharge status: Home / Self Care | Payer: Commercial Managed Care - HMO | Attending: Emergency Medicine | Admitting: Emergency Medicine

## 2015-07-18 DIAGNOSIS — E041 Nontoxic single thyroid nodule: Secondary | ICD-10-CM | POA: Insufficient documentation

## 2015-07-18 DIAGNOSIS — J9801 Acute bronchospasm: Secondary | ICD-10-CM | POA: Insufficient documentation

## 2015-07-18 DIAGNOSIS — R112 Nausea with vomiting, unspecified: Secondary | ICD-10-CM | POA: Insufficient documentation

## 2015-07-18 DIAGNOSIS — E860 Dehydration: Secondary | ICD-10-CM | POA: Insufficient documentation

## 2015-07-18 DIAGNOSIS — Z79899 Other long term (current) drug therapy: Secondary | ICD-10-CM | POA: Diagnosis not present

## 2015-07-18 DIAGNOSIS — I10 Essential (primary) hypertension: Secondary | ICD-10-CM | POA: Diagnosis not present

## 2015-07-18 DIAGNOSIS — R531 Weakness: Secondary | ICD-10-CM | POA: Diagnosis present

## 2015-07-18 DIAGNOSIS — Z8659 Personal history of other mental and behavioral disorders: Secondary | ICD-10-CM | POA: Diagnosis not present

## 2015-07-18 DIAGNOSIS — R1110 Vomiting, unspecified: Secondary | ICD-10-CM

## 2015-07-18 LAB — COMPREHENSIVE METABOLIC PANEL
ALK PHOS: 51 U/L (ref 38–126)
ALT: 20 U/L (ref 14–54)
ANION GAP: 12 (ref 5–15)
AST: 26 U/L (ref 15–41)
Albumin: 3.7 g/dL (ref 3.5–5.0)
BILIRUBIN TOTAL: 0.4 mg/dL (ref 0.3–1.2)
BUN: 17 mg/dL (ref 6–20)
CO2: 28 mmol/L (ref 22–32)
Calcium: 9.2 mg/dL (ref 8.9–10.3)
Chloride: 99 mmol/L — ABNORMAL LOW (ref 101–111)
Creatinine, Ser: 1.47 mg/dL — ABNORMAL HIGH (ref 0.44–1.00)
GFR calc Af Amer: 47 mL/min — ABNORMAL LOW (ref >60)
GFR calc non Af Amer: 41 mL/min — ABNORMAL LOW (ref >60)
Glucose, Bld: 115 mg/dL — ABNORMAL HIGH (ref 65–99)
Potassium: 3.2 mmol/L — ABNORMAL LOW (ref 3.5–5.1)
Sodium: 139 mmol/L (ref 135–145)
TOTAL PROTEIN: 8.1 g/dL (ref 6.5–8.1)

## 2015-07-18 LAB — CBC WITH DIFFERENTIAL/PLATELET
BAND NEUTROPHILS: 0 %
BASOS ABS: 0 10*3/uL (ref 0.0–0.1)
BLASTS: 0 %
Basophils Relative: 1 %
Eosinophils Absolute: 0.2 10*3/uL (ref 0.0–0.7)
Eosinophils Relative: 4 %
HCT: 39.2 % (ref 36.0–46.0)
HEMOGLOBIN: 13.1 g/dL (ref 12.0–15.0)
Lymphocytes Relative: 42 %
Lymphs Abs: 1.9 10*3/uL (ref 0.7–4.0)
MCH: 31.5 pg (ref 26.0–34.0)
MCHC: 33.4 g/dL (ref 30.0–36.0)
MCV: 94.2 fL (ref 78.0–100.0)
MONO ABS: 0.3 10*3/uL (ref 0.1–1.0)
MYELOCYTES: 0 %
Metamyelocytes Relative: 0 %
Monocytes Relative: 6 %
NEUTROS PCT: 47 %
Neutro Abs: 2.2 10*3/uL (ref 1.7–7.7)
Other: 0 %
PROMYELOCYTES ABS: 0 %
Platelets: 263 10*3/uL (ref 150–400)
RBC: 4.16 MIL/uL (ref 3.87–5.11)
RDW: 13 % (ref 11.5–15.5)
WBC: 4.6 10*3/uL (ref 4.0–10.5)
nRBC: 0 /100 WBC

## 2015-07-18 LAB — BASIC METABOLIC PANEL
ANION GAP: 8 (ref 5–15)
BUN: 14 mg/dL (ref 6–20)
CHLORIDE: 104 mmol/L (ref 101–111)
CO2: 25 mmol/L (ref 22–32)
Calcium: 8.2 mg/dL — ABNORMAL LOW (ref 8.9–10.3)
Creatinine, Ser: 1.29 mg/dL — ABNORMAL HIGH (ref 0.44–1.00)
GFR calc non Af Amer: 48 mL/min — ABNORMAL LOW (ref >60)
GFR, EST AFRICAN AMERICAN: 55 mL/min — AB (ref >60)
Glucose, Bld: 142 mg/dL — ABNORMAL HIGH (ref 65–99)
Potassium: 3.5 mmol/L (ref 3.5–5.1)
Sodium: 137 mmol/L (ref 135–145)

## 2015-07-18 LAB — URINE MICROSCOPIC-ADD ON: RBC / HPF: NONE SEEN RBC/hpf (ref 0–5)

## 2015-07-18 LAB — I-STAT CG4 LACTIC ACID, ED
Lactic Acid, Venous: 2.89 mmol/L (ref 0.5–2.0)
Lactic Acid, Venous: 2.91 mmol/L (ref 0.5–2.0)

## 2015-07-18 LAB — URINALYSIS, ROUTINE W REFLEX MICROSCOPIC
Bilirubin Urine: NEGATIVE
Glucose, UA: NEGATIVE mg/dL
HGB URINE DIPSTICK: NEGATIVE
Ketones, ur: NEGATIVE mg/dL
Nitrite: NEGATIVE
PROTEIN: NEGATIVE mg/dL
Specific Gravity, Urine: 1.007 (ref 1.005–1.030)
pH: 6 (ref 5.0–8.0)

## 2015-07-18 MED ORDER — PREDNISONE 20 MG PO TABS: 40.0000 mg | ORAL_TABLET | Freq: Every day | ORAL | Status: DC

## 2015-07-18 MED ORDER — ALBUTEROL SULFATE (2.5 MG/3ML) 0.083% IN NEBU
5.0000 mg | INHALATION_SOLUTION | Freq: Once | RESPIRATORY_TRACT | Status: AC
  Administered 2015-07-18: 5 mg via RESPIRATORY_TRACT
  Filled 2015-07-18: qty 6

## 2015-07-18 MED ORDER — ALBUTEROL SULFATE HFA 108 (90 BASE) MCG/ACT IN AERS
1.0000 | INHALATION_SPRAY | RESPIRATORY_TRACT | Status: DC | PRN
  Administered 2015-07-18: 2 via RESPIRATORY_TRACT
  Filled 2015-07-18: qty 6.7

## 2015-07-18 MED ORDER — PREDNISONE 50 MG PO TABS
60.0000 mg | ORAL_TABLET | Freq: Once | ORAL | Status: AC
  Administered 2015-07-18: 60 mg via ORAL
  Filled 2015-07-18: qty 1

## 2015-07-18 MED ORDER — SODIUM CHLORIDE 0.9 % IV BOLUS (SEPSIS)
1000.0000 mL | Freq: Once | INTRAVENOUS | Status: AC
  Administered 2015-07-18: 1000 mL via INTRAVENOUS

## 2015-07-18 MED ORDER — IOPAMIDOL (ISOVUE-370) INJECTION 76%
100.0000 mL | Freq: Once | INTRAVENOUS | Status: AC | PRN
  Administered 2015-07-18: 100 mL via INTRAVENOUS

## 2015-07-18 MED FILL — predniSONE 20 MG TABS: 20 | 5 days supply | Qty: 10 | Fill #0

## 2015-07-18 NOTE — ED Notes (Signed)
Pt up to bathroom and ambulated in unit, gait steady, tolerated well, pt states she feels some better, still states feels tired.

## 2015-07-18 NOTE — ED Notes (Signed)
MD at bedside. 

## 2015-07-18 NOTE — ED Notes (Signed)
1 liter NS fluid bolus initiated per EDP orders

## 2015-07-18 NOTE — ED Notes (Signed)
States has been ill for the past week with a "GI bug", not tolerating PO fluids still, states was walking into work this am, had episode of feeling weak and dizzy. Presents via EMS, pt very alert and oriented, move self to stretcher

## 2015-07-18 NOTE — ED Notes (Signed)
Patient ambulated in hall on R/A.  HR 95-100, RR 16-20 unlabored, SpO2 100%.

## 2015-07-18 NOTE — ED Notes (Signed)
Patient transported to X-ray via stretcher, sr x 2 up 

## 2015-07-18 NOTE — ED Provider Notes (Addendum)
CSN: [id number]     Arrival date & time 07/18/15  0757 History   First MD Initiated Contact with Patient 07/18/15 [phone]     Chief Complaint  Patient presents with  . Weakness     (Consider location/radiation/quality/duration/timing/severity/associated sxs/prior Treatment) HPI Comments: Patient is a 50 year old female with a history of hypertension presenting today with a near syncopal episode from work. Patient states over the last 7 days she has had nausea and vomiting. It was most severe 7 days ago. She saw her doctor the next day and was given Cipro and Zofran. She has been taking those medications with moderate relief. She states over the last 5 days she has been able to tolerate liquids but when she attempts deep solid she will vomit. She denies any fever but has also developed a cough with some shortness of breath. She denies any abdominal pain and has not had any diarrhea except for 2 weeks ago. She is passing flatus. Today when she was walking into work she started to feel very weak generally. She started to feel lightheaded like she might pass out and became diaphoretic with narrowed vision. This improved with laying down. She also reports not taking her blood pressure medication this morning because she had run out and her daughter was quite a go pick up her refills.  Patient is a 50 y.o. female presenting with weakness. The history is provided by the patient.  Weakness    Past Medical History  Diagnosis Date  . Hypertension   . Depression   . Anxiety    Past Surgical History  Procedure Laterality Date  . Abdominal hysterectomy  2003   No family history on file. Social History  Substance Use Topics  . Smoking status: Never Smoker   . Smokeless tobacco: Never Used  . Alcohol Use: Yes     Comment: socially   OB History    No data available     Review of Systems  Neurological: Positive for weakness.  All other systems reviewed and are negative.     Allergies   Ciprofloxacin hcl  Home Medications   Prior to Admission medications   Medication Sig Start Date End Date Taking? Authorizing Provider  acetaminophen (TYLENOL) 500 MG tablet Take 1,000 mg by mouth every 6 (six) hours as needed for mild pain or headache.     Historical Provider, MD  amLODipine (NORVASC) 10 MG tablet Take 10 mg by mouth daily. 06/09/14   Historical Provider, MD  antipyrine-benzocaine Lyla Son) otic solution Place 3-4 drops into the left ear every 2 (two) hours as needed for ear pain. Patient not taking: Reported on 07/25/2014 08/06/13   Marisa Severin, MD  cephALEXin (KEFLEX) 500 MG capsule Take 1 capsule (500 mg total) by mouth 4 (four) times daily. Patient not taking: Reported on 07/25/2014 08/04/13   Shon Baton, MD  diphenhydrAMINE (BENADRYL ALLERGY) 25 mg capsule Take 25 mg by mouth every 6 (six) hours as needed (allergies).     Historical Provider, MD  HYDROcodone-acetaminophen (NORCO/VICODIN) 5-325 MG per tablet Take 1 tablet by mouth every 6 (six) hours as needed. Patient not taking: Reported on 07/25/2014 08/04/13   Shon Baton, MD  metoprolol succinate (TOPROL-XL) 100 MG 24 hr tablet Take 100 mg by mouth daily. 07/08/14   Historical Provider, MD  pseudoephedrine (SUDAFED) 120 MG 12 hr tablet Take 1 tablet (120 mg total) by mouth 2 (two) times daily. Patient not taking: Reported on 07/25/2014 08/06/13   Marisa Severin, MD  BP 97/68 mmHg  Pulse 76  Temp(Src) 98.2 F (36.8 C) (Oral)  Resp 20  SpO2 98% Physical Exam  Constitutional: She is oriented to person, place, and time. She appears well-developed and well-nourished. No distress.  HENT:  Head: Normocephalic and atraumatic.  Mouth/Throat: Oropharynx is clear and moist. Mucous membranes are dry.  Eyes: Conjunctivae and EOM are normal. Pupils are equal, round, and reactive to light.  Neck: Normal range of motion. Neck supple.  Cardiovascular: Normal rate, regular rhythm and intact distal pulses.   No murmur  heard. Pulmonary/Chest: Effort normal and breath sounds normal. No respiratory distress. She has no wheezes. She has no rales.  Abdominal: Soft. She exhibits no distension. There is no tenderness. There is no rebound and no guarding.  Musculoskeletal: Normal range of motion. She exhibits no edema or tenderness.  Neurological: She is alert and oriented to person, place, and time.  Skin: Skin is warm and dry. No rash noted. No erythema.  Psychiatric: She has a normal mood and affect. Her behavior is normal.  Nursing note and vitals reviewed.   ED Course  Procedures (including critical care time) Labs Review Labs Reviewed  COMPREHENSIVE METABOLIC PANEL - Abnormal; Notable for the following:    Potassium 3.2 (*)    Chloride 99 (*)    Glucose, Bld 115 (*)    Creatinine, Ser 1.47 (*)    GFR calc non Af Amer 41 (*)    GFR calc Af Amer 47 (*)    All other components within normal limits  URINALYSIS, ROUTINE W REFLEX MICROSCOPIC (NOT AT [facility]) - Abnormal; Notable for the following:    APPearance CLOUDY (*)    Leukocytes, UA SMALL (*)    All other components within normal limits  URINE MICROSCOPIC-ADD ON - Abnormal; Notable for the following:    Squamous Epithelial / LPF 6-30 (*)    Bacteria, UA MANY (*)    All other components within normal limits  BASIC METABOLIC PANEL - Abnormal; Notable for the following:    Glucose, Bld 142 (*)    Creatinine, Ser 1.29 (*)    Calcium 8.2 (*)    GFR calc non Af Amer 48 (*)    GFR calc Af Amer 55 (*)    All other components within normal limits  I-STAT CG4 LACTIC ACID, ED - Abnormal; Notable for the following:    Lactic Acid, Venous 2.89 (*)    All other components within normal limits  I-STAT CG4 LACTIC ACID, ED - Abnormal; Notable for the following:    Lactic Acid, Venous 2.91 (*)    All other components within normal limits  CBC WITH DIFFERENTIAL/PLATELET    Imaging Review Dg Chest 2 View  07/18/2015  CLINICAL DATA:  Cough, shortness breath,  and weakness. EXAM: CHEST - 2 VIEW COMPARISON:  Two-view chest x-ray 04/10/2013 and 02/19/2011. FINDINGS: The heart size is normal. A rounded density in the superior mediastinum is more prominent than on the prior exams. With while this may in part be an azygos fissure, a superior mediastinal mass is not excluded. Mild pulmonary vascular congestion is present without frank edema. There is no effusion. No focal airspace consolidation is present. IMPRESSION: 1. Mild pulmonary vascular congestion without frank edema. 2. Increasing prominence of superior mediastinal density. Recommend CT chest with contrast for further evaluation. Electronically Signed   By: Marin Roberts M.D.   On: 07/18/2015 08:50   Ct Angio Chest Pe W/cm &/or Wo Cm  07/18/2015  CLINICAL DATA:  Cough, shortness of breath, dizziness EXAM: CT ANGIOGRAPHY CHEST WITH CONTRAST TECHNIQUE: Multidetector CT imaging of the chest was performed using the standard protocol during bolus administration of intravenous contrast. Multiplanar CT image reconstructions and MIPs were obtained to evaluate the vascular anatomy. CONTRAST:  100 mL Isovue 370 COMPARISON:  None. FINDINGS: There is adequate opacification of the pulmonary arteries. There is no pulmonary embolus. The main pulmonary artery, right main pulmonary artery and left main pulmonary arteries are normal in size. The heart size is normal. There is no pericardial effusion. The lungs are clear. There is no focal consolidation, pleural effusion or pneumothorax. There is significant enlargement of the right thyroid gland. There is right superior mediastinal extension. There is a 3 cm hypodense nodule in the right inferior thyroid gland. There is no axillary, hilar, or mediastinal adenopathy. There is no lytic or blastic osseous lesion. There is a small hiatal hernia. Review of the MIP images confirms the above findings. IMPRESSION: 1. No evidence of pulmonary embolus. 2. Enlargement of the right thyroid  gland with right mediastinal extension. 3. 3 cm hypodense right thyroid mass. Recommend further evaluation with a dedicated thyroid ultrasound. Electronically Signed   By: Elige KoHetal  Patel   On: 07/18/2015 13:40   I have personally reviewed and evaluated these images and lab results as part of my medical decision-making.   EKG Interpretation   Date/Time:  Monday July 18 2015 08:17:38 EDT Ventricular Rate:  70 PR Interval:  165 QRS Duration: 110 QT Interval:  464 QTC Calculation: 501 R Axis:   120 Text Interpretation:  Right and left arm electrode reversal,  interpretation assumes no reversal Sinus rhythm Consider left atrial  enlargement Probable lateral infarct, age indeterminate No significant  change since last tracing Confirmed by [facility]  MD, Alphonzo LemmingsWHITNEY ([id number]) on  07/18/2015 8:24:06 AM      MDM   Final diagnoses:  Dehydration  Bronchospasm  Intractable vomiting with nausea, vomiting of unspecified type  Thyroid nodule    Patient is a 50 year old female with a history of hypertension presenting today with one-week history of vomiting, poor by mouth intake and had a near syncopal event this morning. Patient did not take her blood pressure medication this morning because she had run out but here blood pressures are soft at 97/68 and 107/81. Patient is awake alert and oriented and able to give all history. She had a diarrheal illness several weeks ago but then saw her PCP one week ago for vomiting and was given Cipro and Zofran for concern of possible bacterial GI pathology. She had stated over the week she had had 1-2 episodes of vomiting per day if she attempted to eat solids but was tolerating fluids. She is also developed a cough and some shortness of breath.  No focal exam findings without abdominal tenderness. No tachycardia or abnormal breath sounds. Feel most likely patient's near-syncopal event was related to dehydration due to decreased by mouth intake.  However due to the  cough and persistent symptoms will ensure patient has not developed a pneumonia. She will be given IV fluids. CBC, CMP, UA, lactic acid and EKG pending  9:58 AM Labs are consistent with AK I most likely related to dehydration. Lactic acid was 2.89. Patient's blood pressure improved greatly after 1 L of fluid and she was given a second. Blood sugar was fine. On chest x-ray no signs of a pneumonia but there was an mediastinal lesion that they recommended CT with contrast 4. Given patient's AK I  and she does have a primary physician who can follow-up feel that is reasonable for her to follow-up in the next few weeks to get a CT opposed to giving her contrast load today. Do not feel that that's the cause of her symptoms.  11:02 AM Pt was able to ambulate to the bathroom and starting to feel better.  Tolerating sprite but will also try crackers.  11:40 AM On reevaluation patient states she feels some better but she is still having persistent coughing on exam and states a lot of her vomiting is related to the cough. We'll give albuterol inhaler to see if that improves the cough. Will recheck BMP and if creatinine is improved will scan her chest today  2:03 PM Renal function improved after boluses. Patient's lactic acid increased slightly however she was getting albuterol so it may have falsely elevated it. After albuterol patient feels significantly improved. CT of the chest without signs of pulmonary edema, PE or pneumonia. She does have a thyroid nodule that she will follow-up with her PCP.  Patient ambulated in the department and this time without shortness of breath.  Will DC home to follow-up with PCP later this week for recheck. Given strict return precautions. Gwyneth Sprout, MD 07/18/15 1103  Gwyneth Sprout, MD 07/18/15 1404  Gwyneth Sprout, MD 07/18/15 1424

## 2015-07-18 NOTE — ED Notes (Signed)
Patient ambulated in hall, HR 80-85, SpO2 95-100% on r/a, RR 18-24. + DOE.

## 2015-09-14 ENCOUNTER — Encounter: Payer: Self-pay | Admitting: Nurse Practitioner

## 2015-09-14 ENCOUNTER — Other Ambulatory Visit: Payer: Self-pay

## 2015-09-14 ENCOUNTER — Ambulatory Visit: Payer: Self-pay | Admitting: Nurse Practitioner

## 2015-09-14 ENCOUNTER — Encounter (INDEPENDENT_AMBULATORY_CARE_PROVIDER_SITE_OTHER): Payer: Self-pay

## 2015-09-14 ENCOUNTER — Ambulatory Visit (HOSPITAL_COMMUNITY)
Admission: RE | Admit: 2015-09-14 | Discharge: 2015-09-14 | Disposition: A | Discharge status: Home / Self Care | Payer: Commercial Managed Care - HMO | Source: Ambulatory Visit | Attending: Nurse Practitioner | Admitting: Nurse Practitioner

## 2015-09-14 ENCOUNTER — Ambulatory Visit (INDEPENDENT_AMBULATORY_CARE_PROVIDER_SITE_OTHER): Payer: Commercial Managed Care - HMO | Admitting: Nurse Practitioner

## 2015-09-14 VITALS — BP 128/85 | HR 78 | Temp 98.2°F | Ht 63.0 in | Wt 218.4 lb

## 2015-09-14 DIAGNOSIS — R1030 Lower abdominal pain, unspecified: Secondary | ICD-10-CM

## 2015-09-14 DIAGNOSIS — Z1211 Encounter for screening for malignant neoplasm of colon: Secondary | ICD-10-CM | POA: Diagnosis not present

## 2015-09-14 DIAGNOSIS — R112 Nausea with vomiting, unspecified: Secondary | ICD-10-CM

## 2015-09-14 DIAGNOSIS — R109 Unspecified abdominal pain: Secondary | ICD-10-CM

## 2015-09-14 DIAGNOSIS — K219 Gastro-esophageal reflux disease without esophagitis: Secondary | ICD-10-CM

## 2015-09-14 MED ORDER — OMEPRAZOLE 20 MG PO CPDR: 20.0000 mg | DELAYED_RELEASE_CAPSULE | Freq: Every day | ORAL | Status: DC

## 2015-09-14 MED ORDER — PEG 3350-KCL-NA BICARB-NACL 420 G PO SOLR: 4000.0000 mL | Freq: Once | ORAL | Status: DC

## 2015-09-14 NOTE — Assessment & Plan Note (Signed)
Abdominal pain worse during episodes of constipation. Constipation likely contributing to her abdominal pain at least in part. I will start her on Linzess 145 g for chronic idiopathic constipation. Can titrate down dose if concerns with diarrhea or up dose if not effective enough. I will also obtain abdominal x-ray to assess for stool burden. Return for follow-up in 2 months.

## 2015-09-14 NOTE — Assessment & Plan Note (Signed)
Last colonoscopy 10 years ago, currently due for repeat exam. She is age 50 [place]14[place] female. This will also allow further evaluation of her constipation and abdominal pain, although I feel these are likely related as per above. We'll proceed with recommended screening colonoscopy.  Proceed with TCS with Dr. Jena Gaussourk in near future: the risks, benefits, and alternatives have been discussed with the patient in detail. The patient states understanding and desires to proceed.  The patient is not on any anticoagulants, anxiolytics, chronic pain medications, antidepressants. Rare alcohol use, denies drug use. Conscious sedation should be adequate for her procedure as it was for her last procedure.

## 2015-09-14 NOTE — Assessment & Plan Note (Signed)
Patient with a history of GERD which was doing worse before switching to a bland diet. GERD could be contributing to her nausea and vomiting especially because it is triggered by certain foods, specifically spicy foods, fatty foods, fried foods. Recommend she avoid these types of foods. We'll start her on Prilosec 20 mg to see if this improves her symptoms. Return for follow-up in 2 months.

## 2015-09-14 NOTE — Progress Notes (Signed)
Primary Care Physician:  Karie ChimeraEESE,BETTI D, MD Primary Gastroenterologist:  Dr. Jena Gaussourk  Chief Complaint  Patient presents with  . Dysphagia    HPI:   Leslie Rodriguez is a 50 y.o. female who presents For evaluation of vomiting and dysphagia. Last seen in our office 2007. She had her last colonoscopy on 01/16/2006 for recent presumed diverticulitis stable on antibiotics. Findings included left-sided transverse diverticula, remaining colonic mucosa normal, normal rectum.   Today she states she is not having dysphagia. Has issues keeping food down, only with certain foods. Currently eating a bland diet. Typically fired/fatty foods are trigger foods. Last week had one chicken wing, became nauseated and eventually vomited at which point she felt better. Rare GERD symptoms on bland diet, previously was having GERD symptoms "every once in a while, especially if I ate late at night." Has been eating grilled meats, vegetables, avoiding gassy foods. Occasional constipation. Has hemorrhoids and has been having toilet tissue hematochezia. Denies melena, fever, chills, unintentional weight loss. Also with abdominal pain lower abdomen, described as "a sharp, dull, crampy pain" typically associated with episodes of constipation. Has a bowel movement sometimes only once a week and will then have follow-up looser/soft stools. Denies chest pain, dyspnea, dizziness, lightheadedness, syncope, near syncope. Denies any other upper or lower GI symptoms.  No colonoscopy since last done here in 2007.  Past Medical History  Diagnosis Date  . Hypertension   . Depression   . Anxiety     Past Surgical History  Procedure Laterality Date  . Abdominal hysterectomy  2003  . Colonoscopy  01/16/2006    RMR: normal rectum/left sided transverse diverticula    Current Outpatient Prescriptions  Medication Sig Dispense Refill  . acetaminophen (TYLENOL) 500 MG tablet Take 1,000 mg by mouth every 6 (six) hours as needed for  mild pain or headache.     . diphenhydrAMINE (BENADRYL ALLERGY) 25 mg capsule Take 25 mg by mouth every 6 (six) hours as needed (allergies).     Marland Kitchen. levothyroxine (SYNTHROID, LEVOTHROID) 50 MCG tablet Take 50 mcg by mouth daily before breakfast.     . metoprolol succinate (TOPROL-XL) 100 MG 24 hr tablet Take 100 mg by mouth daily.    Marland Kitchen. triamterene-hydrochlorothiazide (MAXZIDE-25) 37.5-25 MG tablet     . amLODipine (NORVASC) 10 MG tablet Take 10 mg by mouth daily. Reported on 09/14/2015    . antipyrine-benzocaine (AURALGAN) otic solution Place 3-4 drops into the left ear every 2 (two) hours as needed for ear pain. (Patient not taking: Reported on 07/25/2014) 10 mL 0  . cephALEXin (KEFLEX) 500 MG capsule Take 1 capsule (500 mg total) by mouth 4 (four) times daily. (Patient not taking: Reported on 07/25/2014) 28 capsule 0  . HYDROcodone-acetaminophen (NORCO/VICODIN) 5-325 MG per tablet Take 1 tablet by mouth every 6 (six) hours as needed. (Patient not taking: Reported on 07/25/2014) 10 tablet 0  . predniSONE (DELTASONE) 20 MG tablet Take 2 tablets (40 mg total) by mouth daily. (Patient not taking: Reported on 09/14/2015) 10 tablet 0  . pseudoephedrine (SUDAFED) 120 MG 12 hr tablet Take 1 tablet (120 mg total) by mouth 2 (two) times daily. (Patient not taking: Reported on 07/25/2014) 60 tablet 0   No current facility-administered medications for this visit.    Allergies as of 09/14/2015 - Review Complete 09/14/2015  Allergen Reaction Noted  . Ciprofloxacin hcl Hives and Shortness Of Breath 07/27/2011    No family history on file.  Social History   Social  History  . Marital Status: Single    Spouse Name: N/A  . Number of Children: N/A  . Years of Education: N/A   Occupational History  . Not on file.   Social History Main Topics  . Smoking status: Never Smoker   . Smokeless tobacco: Never Used  . Alcohol Use: Yes     Comment: socially  . Drug Use: No  . Sexual Activity: Not Currently    Other Topics Concern  . Not on file   Social History Narrative    Review of Systems: General: Negative for anorexia, weight loss, fever, chills, fatigue, weakness. ENT: Negative for hoarseness, difficulty swallowing. CV: Negative for chest pain, angina, palpitations, peripheral edema.  Respiratory: Negative for dyspnea at rest, cough, sputum, wheezing.  GI: See history of present illness. Derm: Negative for rash or itching.  Psych: Negative for worsening anxiety, depression.  Endo: Negative for unusual weight change.  Heme: Negative for bruising or bleeding. Allergy: Negative for rash or hives.    Physical Exam: BP 128/85 mmHg  Pulse 78  Temp(Src) 98.2 F (36.8 C) (Oral)  Ht 5' 3" (1.6 m)  Wt 218 lb 6.4 oz (99.066 kg)  BMI 38.70 kg/m2 General:   Alert and oriented. Pleasant and cooperative. Well-nourished and well-developed.  Head:  Normocephalic and atraumatic. Eyes:  Without icterus, sclera clear and conjunctiva pink.  Ears:  Normal auditory acuity. Cardiovascular:  S1, S2 present without murmurs appreciated. Extremities without clubbing or edema. Respiratory:  Clear to auscultation bilaterally. No wheezes, rales, or rhonchi. No distress.  Gastrointestinal:  +BS, rounded but soft, and non-distended. Mild lower abdominal TTP worse in LLQ. No HSM noted. No guarding or rebound. No masses appreciated.  Rectal:  Deferred  Musculoskalatal:  Symmetrical without gross deformities. Neurologic:  Alert and oriented x4;  grossly normal neurologically. Psych:  Alert and cooperative. Normal mood and affect. Heme/Lymph/Immune: No excessive bruising noted.    09/14/2015 2:26 PM   Disclaimer: This note was dictated with voice recognition software. Similar sounding words can inadvertently be transcribed and may not be corrected upon review.  

## 2015-09-14 NOTE — Patient Instructions (Signed)
1. Have your x-ray done when you're able to. 2. I sent in a prescription for Prilosec 20 mg to your pharmacy. Take one pill 30 minutes prior to your first meal the day. Take this once a day. 3. We will give the samples of Linzess 145 g. Take 1 a day on an empty stomach. You can take it at the same time as your Prilosec. Notify us of any persistent side effects. 4. We will schedule your procedure for you. 5. Return for follow-up in 2 months.

## 2015-09-14 NOTE — Assessment & Plan Note (Signed)
Nausea and vomiting worse with fried and fatty foods as well as spicy foods. Is on a bland diet and trying to avoid trigger foods which is improving her symptoms some. She is wanting to try a medication, we'll start her on Prilosec 20 mg once daily to see if this improves her symptoms. Return for follow-up in 2 months.

## 2015-09-15 NOTE — Progress Notes (Signed)
cc'ed to pcp °

## 2015-09-21 ENCOUNTER — Encounter (HOSPITAL_COMMUNITY): Payer: Self-pay | Admitting: *Deleted

## 2015-09-21 ENCOUNTER — Ambulatory Visit (HOSPITAL_COMMUNITY)
Admission: RE | Admit: 2015-09-21 | Discharge: 2015-09-21 | Disposition: A | Discharge status: Home / Self Care | Payer: Commercial Managed Care - HMO | Source: Ambulatory Visit | Attending: Internal Medicine | Admitting: Internal Medicine

## 2015-09-21 ENCOUNTER — Encounter (HOSPITAL_COMMUNITY)
Admission: RE | Disposition: A | Discharge status: Home / Self Care | Payer: Self-pay | Source: Ambulatory Visit | Attending: Internal Medicine

## 2015-09-21 DIAGNOSIS — Z1211 Encounter for screening for malignant neoplasm of colon: Secondary | ICD-10-CM | POA: Diagnosis not present

## 2015-09-21 DIAGNOSIS — Z79899 Other long term (current) drug therapy: Secondary | ICD-10-CM | POA: Diagnosis not present

## 2015-09-21 DIAGNOSIS — K642 Third degree hemorrhoids: Secondary | ICD-10-CM | POA: Diagnosis not present

## 2015-09-21 DIAGNOSIS — K5730 Diverticulosis of large intestine without perforation or abscess without bleeding: Secondary | ICD-10-CM | POA: Diagnosis not present

## 2015-09-21 DIAGNOSIS — R112 Nausea with vomiting, unspecified: Secondary | ICD-10-CM

## 2015-09-21 DIAGNOSIS — F419 Anxiety disorder, unspecified: Secondary | ICD-10-CM | POA: Diagnosis not present

## 2015-09-21 DIAGNOSIS — I10 Essential (primary) hypertension: Secondary | ICD-10-CM | POA: Diagnosis not present

## 2015-09-21 DIAGNOSIS — R109 Unspecified abdominal pain: Secondary | ICD-10-CM

## 2015-09-21 DIAGNOSIS — F329 Major depressive disorder, single episode, unspecified: Secondary | ICD-10-CM | POA: Insufficient documentation

## 2015-09-21 DIAGNOSIS — K5790 Diverticulosis of intestine, part unspecified, without perforation or abscess without bleeding: Secondary | ICD-10-CM | POA: Diagnosis not present

## 2015-09-21 HISTORY — PX: COLONOSCOPY: SHX5424

## 2015-09-21 SURGERY — COLONOSCOPY
Anesthesia: Moderate Sedation

## 2015-09-21 MED ORDER — STERILE WATER FOR IRRIGATION IR SOLN
Status: DC | PRN
  Administered 2015-09-21: 09:00:00

## 2015-09-21 MED ORDER — MEPERIDINE HCL 100 MG/ML IJ SOLN
INTRAMUSCULAR | Status: AC
  Filled 2015-09-21: qty 2

## 2015-09-21 MED ORDER — MIDAZOLAM HCL 5 MG/5ML IJ SOLN
INTRAMUSCULAR | Status: AC
  Filled 2015-09-21: qty 10

## 2015-09-21 MED ORDER — ONDANSETRON HCL 4 MG/2ML IJ SOLN
INTRAMUSCULAR | Status: AC
  Filled 2015-09-21: qty 2

## 2015-09-21 MED ORDER — MIDAZOLAM HCL 5 MG/5ML IJ SOLN
INTRAMUSCULAR | Status: DC | PRN
  Administered 2015-09-21: 1 mg via INTRAVENOUS
  Administered 2015-09-21: 2 mg via INTRAVENOUS
  Administered 2015-09-21 (×3): 1 mg via INTRAVENOUS

## 2015-09-21 MED ORDER — MEPERIDINE HCL 100 MG/ML IJ SOLN
INTRAMUSCULAR | Status: DC | PRN
  Administered 2015-09-21 (×3): 25 mg via INTRAVENOUS
  Administered 2015-09-21: 50 mg via INTRAVENOUS

## 2015-09-21 MED ORDER — LIDOCAINE HCL 2 % EX GEL
CUTANEOUS | Status: AC
  Filled 2015-09-21: qty 30

## 2015-09-21 MED ORDER — SODIUM CHLORIDE 0.9 % IV SOLN
INTRAVENOUS | Status: DC
  Administered 2015-09-21: 08:00:00 via INTRAVENOUS

## 2015-09-21 MED ORDER — ONDANSETRON HCL 4 MG/2ML IJ SOLN
INTRAMUSCULAR | Status: DC | PRN
  Administered 2015-09-21: 4 mg via INTRAVENOUS

## 2015-09-21 NOTE — Op Note (Signed)
Hospital Patient Name: Leslie SearsHilda Rodriguez Procedure Date: 6/7/2[facility]017 8:26 AM MRN: [id number] Date of Birth: [birth year] Attending MD: Gennette Pacobert Michael Rourk , MD CSN: [id number] Age: 50 Admit Type: Outpatient Procedure:                Colonoscopy Screening Indications:              Screening for colorectal malignant neoplasm Providers:                Gennette Pacobert Michael Rourk, MD, Jannett CelestineAnitra Bell, RN, Burke Keelsrisann                            Tilley, Technician Referring MD:              Medicines:                Midazolam 6 mg IV, Meperidine 125 mg IV,                            Ondansetron 4 mg IV Complications:            No immediate complications. Estimated Blood Loss:     Estimated blood loss: none. Estimated blood loss:                            none. Procedure:                Pre-Anesthesia Assessment:                           - Prior to the procedure, a History and Physical                            was performed, and patient medications and                            allergies were reviewed. The patient's tolerance of                            previous anesthesia was also reviewed. The risks                            and benefits of the procedure and the sedation                            options and risks were discussed with the patient.                            All questions were answered, and informed consent                            was obtained. Prior Anticoagulants: The patient has                            taken no previous anticoagulant or antiplatelet  agents. ASA Grade Assessment: II - A patient with                            mild systemic disease. After reviewing the risks                            and benefits, the patient was deemed in                            satisfactory condition to undergo the procedure.                           After obtaining informed consent, the colonoscope                            was passed under direct  vision. Throughout the                            procedure, the patient's blood pressure, pulse, and                            oxygen saturations were monitored continuously. The                            EC-[id number] ([id number]) scope was introduced through                            the anus and advanced to the the cecum, identified                            by appendiceal orifice and ileocecal valve. The                            colonoscopy was performed without difficulty. The                            patient tolerated the procedure well. The quality                            of the bowel preparation was adequate. The                            ileocecal valve, appendiceal orifice, and rectum                            were photographed. Scope In: 8:49:29 AM Scope Out: 9:03:27 AM Scope Withdrawal Time: 0 hours 6 minutes 42 seconds  Total Procedure Duration: 0 hours 13 minutes 58 seconds  Findings:      Internal hemorrhoids were found during retroflexion. The hemorrhoids       were Grade III (internal hemorrhoids that prolapse but require manual       reduction).      Many medium-mouthed diverticula were found in the entire colon.      The perianal and digital rectal examinations  were normal. Impression:               - Internal hemorrhoids.                           - Diverticulosis in the entire examined colon.                           - No specimens collected. Moderate Sedation:      Moderate (conscious) sedation was administered by the endoscopy nurse       and supervised by the endoscopist. The following parameters were       monitored: oxygen saturation, heart rate, blood pressure, respiratory       rate, EKG, adequacy of pulmonary ventilation, and response to care.       Total physician intraservice time was 32 minutes. Recommendation:           - Written discharge instructions were provided to                            the patient.                           - Patient  has a contact number available for                            emergencies. The signs and symptoms of potential                            delayed complications were discussed with the                            patient. Return to normal activities tomorrow.                            Written discharge instructions were provided to the                            patient.                           - Advance diet as tolerated.                           - Continue present medications.                           - Return to GI office in 6 weeks.                           - Repeat colonoscopy in 10 years for screening                            purposes. Procedure Code(s):        --- Professional ---                           [phone], Colonoscopy, flexible; diagnostic,  including                            collection of specimen(s) by brushing or washing,                            when performed (separate procedure)                           99152, Moderate sedation services provided by the                            same physician or other qualified health care                            professional performing the diagnostic or                            therapeutic service that the sedation supports,                            requiring the presence of an independent trained                            observer to assist in the monitoring of the                            patient's level of consciousness and physiological                            status; initial 15 minutes of intraservice time,                            patient age 40 years or older                           [phone], Moderate sedation services; each additional                            15 minutes intraservice time Diagnosis Code(s):        --- Professional ---                           Z12.11, Encounter for screening for malignant                            neoplasm of colon CPT copyright 2016 [employer]. All  rights reserved. The codes documented in this report are preliminary and upon coder review may  be revised to meet current compliance requirements. Gerrit Friends. Rourk, MD Gennette Pac, MD 09/21/2015 9:29:19 AM This report has been signed electronically. Number of Addenda: 0

## 2015-09-21 NOTE — Interval H&P Note (Signed)
History and Physical Interval Note:  09/21/2015 8:30 AM  Leslie Rodriguez  has presented today for surgery, with the diagnosis of abdominal pain, nausea, vomiting  The various methods of treatment have been discussed with the patient and family. After consideration of risks, benefits and other options for treatment, the patient has consented to  Procedure(s) with comments: COLONOSCOPY (N/A) - 0830 as a surgical intervention .  The patient's history has been reviewed, patient examined, no change in status, stable for surgery.  I have reviewed the patient's chart and labs.  Questions were answered to the patient's satisfaction.     Robert Rourk  No change. Screening colonoscopy per plan. The risks, benefits, limitations, alternatives and imponderables have been reviewed with the patient. Questions have been answered. All parties are agreeable.

## 2015-09-21 NOTE — Discharge Instructions (Signed)
Marland Kitchen Colonoscopy Discharge Instructions  Read the instructions outlined below and refer to this sheet in the next few weeks. These discharge instructions provide you with general information on caring for yourself after you leave the hospital. Your doctor may also give you specific instructions. While your treatment has been planned according to the most current medical practices available, unavoidable complications occasionally occur. If you have any problems or questions after discharge, call Dr. Jena Gauss at [phone]. ACTIVITY  You may resume your regular activity, but move at a slower pace for the next 24 hours.   Take frequent rest periods for the next 24 hours.   Walking will help get rid of the air and reduce the bloated feeling in your belly (abdomen).   No driving for 24 hours (because of the medicine (anesthesia) used during the test).    Do not sign any important legal documents or operate any machinery for 24 hours (because of the anesthesia used during the test).  NUTRITION  Drink plenty of fluids.   You may resume your normal diet as instructed by your doctor.   Begin with a light meal and progress to your normal diet. Heavy or fried foods are harder to digest and may make you feel sick to your stomach (nauseated).   Avoid alcoholic beverages for 24 hours or as instructed.  MEDICATIONS  You may resume your normal medications unless your doctor tells you otherwise.  WHAT YOU CAN EXPECT TODAY  Some feelings of bloating in the abdomen.   Passage of more gas than usual.   Spotting of blood in your stool or on the toilet paper.  IF YOU HAD POLYPS REMOVED DURING THE COLONOSCOPY:  No aspirin products for 7 days or as instructed.   No alcohol for 7 days or as instructed.   Eat a soft diet for the next 24 hours.  FINDING OUT THE RESULTS OF YOUR TEST Not all test results are available during your visit. If your test results are not back during the visit, make an appointment  with your caregiver to find out the results. Do not assume everything is normal if you have not heard from your caregiver or the medical facility. It is important for you to follow up on all of your test results.  SEEK IMMEDIATE MEDICAL ATTENTION IF:  You have more than a spotting of blood in your stool.   Your belly is swollen (abdominal distention).   You are nauseated or vomiting.   You have a temperature over 101.  You have abdominal pain or discomfort that is severe or gets worse throughout the day.     Diverticulosis information provided  Repeat colonoscopy in 10 years  Office visit with [place] as scheduled in about 6 weeks from now   Diverticulosis Diverticulosis is the condition that develops when small pouches (diverticula) form in the wall of your colon. Your colon, or large intestine, is where water is absorbed and stool is formed. The pouches form when the inside layer of your colon pushes through weak spots in the outer layers of your colon. CAUSES  No one knows exactly what causes diverticulosis. RISK FACTORS  Being older than 50. Your risk for this condition increases with age. Diverticulosis is rare in people younger than 40 years. By age 48, almost everyone has it.  Eating a low-fiber diet.  Being frequently constipated.  Being overweight.  Not getting enough exercise.  Smoking.  Taking over-the-counter pain medicines, like aspirin and ibuprofen. SYMPTOMS  Most people  with diverticulosis do not have symptoms. DIAGNOSIS  Because diverticulosis often has no symptoms, health care providers often discover the condition during an exam for other colon problems. In many cases, a health care provider will diagnose diverticulosis while using a flexible scope to examine the colon (colonoscopy). TREATMENT  If you have never developed an infection related to diverticulosis, you may not need treatment. If you have had an infection before, treatment may include:  Eating  more fruits, vegetables, and grains.  Taking a fiber supplement.  Taking a live bacteria supplement (probiotic).  Taking medicine to relax your colon. HOME CARE INSTRUCTIONS   Drink at least 6-8 glasses of water each day to prevent constipation.  Try not to strain when you have a bowel movement.  Keep all follow-up appointments. If you have had an infection before:  Increase the fiber in your diet as directed by your health care provider or dietitian.  Take a dietary fiber supplement if your health care provider approves.  Only take medicines as directed by your health care provider. SEEK MEDICAL CARE IF:   You have abdominal pain.  You have bloating.  You have cramps.  You have not gone to the bathroom in 3 days. SEEK IMMEDIATE MEDICAL CARE IF:   Your pain gets worse.  Yourbloating becomes very bad.  You have a fever or chills, and your symptoms suddenly get worse.  You begin vomiting.  You have bowel movements that are bloody or black. MAKE SURE YOU:  Understand these instructions.  Will watch your condition.  Will get help right away if you are not doing well or get worse.   This information is not intended to replace advice given to you by your health care provider. Make sure you discuss any questions you have with your health care provider.   Document Released: 12/29/2003 Document Revised: 04/07/2013 Document Reviewed: 02/25/2013 Elsevier Interactive Patient Education [employer]2016 [employer]

## 2015-09-21 NOTE — H&P (View-Only) (Signed)
Primary Care Physician:  Karie ChimeraEESE,BETTI D, MD Primary Gastroenterologist:  Dr. Jena Gaussourk  Chief Complaint  Patient presents with  . Dysphagia    HPI:   Leslie Rodriguez is a 50 y.o. female who presents For evaluation of vomiting and dysphagia. Last seen in our office 2007. She had her last colonoscopy on 01/16/2006 for recent presumed diverticulitis stable on antibiotics. Findings included left-sided transverse diverticula, remaining colonic mucosa normal, normal rectum.   Today she states she is not having dysphagia. Has issues keeping food down, only with certain foods. Currently eating a bland diet. Typically fired/fatty foods are trigger foods. Last week had one chicken wing, became nauseated and eventually vomited at which point she felt better. Rare GERD symptoms on bland diet, previously was having GERD symptoms "every once in a while, especially if I ate late at night." Has been eating grilled meats, vegetables, avoiding gassy foods. Occasional constipation. Has hemorrhoids and has been having toilet tissue hematochezia. Denies melena, fever, chills, unintentional weight loss. Also with abdominal pain lower abdomen, described as "a sharp, dull, crampy pain" typically associated with episodes of constipation. Has a bowel movement sometimes only once a week and will then have follow-up looser/soft stools. Denies chest pain, dyspnea, dizziness, lightheadedness, syncope, near syncope. Denies any other upper or lower GI symptoms.  No colonoscopy since last done here in 2007.  Past Medical History  Diagnosis Date  . Hypertension   . Depression   . Anxiety     Past Surgical History  Procedure Laterality Date  . Abdominal hysterectomy  2003  . Colonoscopy  01/16/2006    RMR: normal rectum/left sided transverse diverticula    Current Outpatient Prescriptions  Medication Sig Dispense Refill  . acetaminophen (TYLENOL) 500 MG tablet Take 1,000 mg by mouth every 6 (six) hours as needed for  mild pain or headache.     . diphenhydrAMINE (BENADRYL ALLERGY) 25 mg capsule Take 25 mg by mouth every 6 (six) hours as needed (allergies).     Marland Kitchen. levothyroxine (SYNTHROID, LEVOTHROID) 50 MCG tablet Take 50 mcg by mouth daily before breakfast.     . metoprolol succinate (TOPROL-XL) 100 MG 24 hr tablet Take 100 mg by mouth daily.    Marland Kitchen. triamterene-hydrochlorothiazide (MAXZIDE-25) 37.5-25 MG tablet     . amLODipine (NORVASC) 10 MG tablet Take 10 mg by mouth daily. Reported on 09/14/2015    . antipyrine-benzocaine (AURALGAN) otic solution Place 3-4 drops into the left ear every 2 (two) hours as needed for ear pain. (Patient not taking: Reported on 07/25/2014) 10 mL 0  . cephALEXin (KEFLEX) 500 MG capsule Take 1 capsule (500 mg total) by mouth 4 (four) times daily. (Patient not taking: Reported on 07/25/2014) 28 capsule 0  . HYDROcodone-acetaminophen (NORCO/VICODIN) 5-325 MG per tablet Take 1 tablet by mouth every 6 (six) hours as needed. (Patient not taking: Reported on 07/25/2014) 10 tablet 0  . predniSONE (DELTASONE) 20 MG tablet Take 2 tablets (40 mg total) by mouth daily. (Patient not taking: Reported on 09/14/2015) 10 tablet 0  . pseudoephedrine (SUDAFED) 120 MG 12 hr tablet Take 1 tablet (120 mg total) by mouth 2 (two) times daily. (Patient not taking: Reported on 07/25/2014) 60 tablet 0   No current facility-administered medications for this visit.    Allergies as of 09/14/2015 - Review Complete 09/14/2015  Allergen Reaction Noted  . Ciprofloxacin hcl Hives and Shortness Of Breath 07/27/2011    No family history on file.  Social History   Social  History  . Marital Status: Single    Spouse Name: N/A  . Number of Children: N/A  . Years of Education: N/A   Occupational History  . Not on file.   Social History Main Topics  . Smoking status: Never Smoker   . Smokeless tobacco: Never Used  . Alcohol Use: Yes     Comment: socially  . Drug Use: No  . Sexual Activity: Not Currently    Other Topics Concern  . Not on file   Social History Narrative    Review of Systems: General: Negative for anorexia, weight loss, fever, chills, fatigue, weakness. ENT: Negative for hoarseness, difficulty swallowing. CV: Negative for chest pain, angina, palpitations, peripheral edema.  Respiratory: Negative for dyspnea at rest, cough, sputum, wheezing.  GI: See history of present illness. Derm: Negative for rash or itching.  Psych: Negative for worsening anxiety, depression.  Endo: Negative for unusual weight change.  Heme: Negative for bruising or bleeding. Allergy: Negative for rash or hives.    Physical Exam: BP 128/85 mmHg  Pulse 78  Temp(Src) 98.2 F (36.8 C) (Oral)  Ht  (1.6 m)  Wt 218 lb 6.4 oz (99.066 kg)  BMI 38.70 kg/m2 General:   Alert and oriented. Pleasant and cooperative. Well-nourished and well-developed.  Head:  Normocephalic and atraumatic. Eyes:  Without icterus, sclera clear and conjunctiva pink.  Ears:  Normal auditory acuity. Cardiovascular:  S1, S2 present without murmurs appreciated. Extremities without clubbing or edema. Respiratory:  Clear to auscultation bilaterally. No wheezes, rales, or rhonchi. No distress.  Gastrointestinal:  +BS, rounded but soft, and non-distended. Mild lower abdominal TTP worse in LLQ. No HSM noted. No guarding or rebound. No masses appreciated.  Rectal:  Deferred  Musculoskalatal:  Symmetrical without gross deformities. Neurologic:  Alert and oriented x4;  grossly normal neurologically. Psych:  Alert and cooperative. Normal mood and affect. Heme/Lymph/Immune: No excessive bruising noted.    09/14/2015 2:26 PM   Disclaimer: This note was dictated with voice recognition software. Similar sounding words can inadvertently be transcribed and may not be corrected upon review.

## 2015-09-23 ENCOUNTER — Encounter (HOSPITAL_COMMUNITY): Payer: Self-pay | Admitting: Internal Medicine

## 2015-11-14 ENCOUNTER — Ambulatory Visit: Payer: Self-pay | Admitting: Nurse Practitioner

## 2015-11-15 ENCOUNTER — Other Ambulatory Visit: Payer: Self-pay

## 2015-11-16 ENCOUNTER — Ambulatory Visit: Payer: Self-pay | Admitting: Nurse Practitioner

## 2015-11-16 MED ORDER — LINACLOTIDE 72 MCG PO CAPS: 72.0000 ug | ORAL_CAPSULE | Freq: Every day | ORAL | 3 refills | Status: DC

## 2015-11-23 ENCOUNTER — Telehealth: Payer: Self-pay | Admitting: Internal Medicine

## 2015-11-23 NOTE — Telephone Encounter (Signed)
rx was escribed on 11/16/15 and receipt was confirmed by the pharmacy. I called the [facility]- and gave them rx details and asked them to let the pt know when it was ready.

## 2015-11-23 NOTE — Telephone Encounter (Signed)
Pt called to say that her pharmacy has not received anything from us about getting her Linzess. There was a confirmed receipt that it went to her pharmacy on 11/16/15. Please re-send

## 2015-12-13 ENCOUNTER — Other Ambulatory Visit: Payer: Self-pay | Admitting: Nurse Practitioner

## 2015-12-14 ENCOUNTER — Ambulatory Visit: Payer: Self-pay | Admitting: Gastroenterology

## 2016-01-11 ENCOUNTER — Ambulatory Visit: Payer: Self-pay | Admitting: Gastroenterology

## 2016-02-15 ENCOUNTER — Ambulatory Visit (INDEPENDENT_AMBULATORY_CARE_PROVIDER_SITE_OTHER): Payer: Commercial Managed Care - HMO | Admitting: Gastroenterology

## 2016-02-15 ENCOUNTER — Encounter: Payer: Self-pay | Admitting: Gastroenterology

## 2016-02-15 DIAGNOSIS — K625 Hemorrhage of anus and rectum: Secondary | ICD-10-CM | POA: Diagnosis not present

## 2016-02-15 MED ORDER — HYDROCORTISONE 2.5 % RE CREA: 1.0000 "application " | TOPICAL_CREAM | Freq: Two times a day (BID) | RECTAL | 1 refills | Status: DC

## 2016-02-15 NOTE — Progress Notes (Signed)
Referring Provider: Leilani Ableeese, Betti, MD Primary Care Physician:  Karie ChimeraEESE,BETTI D, MD  Primary GI: Dr. Jena Gaussourk   Chief Complaint  Patient presents with  . Rectal Bleeding    HPI:   Leslie Rodriguez is a 50 y.o. female presenting today with a history of GERD, internal hemorrhoids, constipation, colonoscopy earlier this year with internal hemorrhoids and diverticulosis.   Rectal bleeding started a few days ago. Taking Linzess 72 mcg and has good bowel movements in the mornings but in the evenings has little balls. Linzess 145 was too strong.  Has some lower abdominal discomfort. No fever or chills. Hard balls. Denies any new medications, no changes in diet. Has noted more stress.   Past Medical History:  Diagnosis Date  . Anxiety   . Depression   . Hypertension     Past Surgical History:  Procedure Laterality Date  . ABDOMINAL HYSTERECTOMY  2003  . CESAREAN SECTION  1985  . COLONOSCOPY  01/16/2006   RMR: normal rectum/left sided transverse diverticula  . COLONOSCOPY N/A 09/21/2015   Dr. Jena Gaussourk: internal hemorrhoids, diverticulosis     Current Outpatient Prescriptions  Medication Sig Dispense Refill  . levothyroxine (SYNTHROID, LEVOTHROID) 50 MCG tablet Take 50 mcg by mouth daily before breakfast.     . linaclotide (LINZESS) 72 MCG capsule Take 1 capsule (72 mcg total) by mouth daily before breakfast. 90 capsule 3  . metoprolol succinate (TOPROL-XL) 100 MG 24 hr tablet Take 100 mg by mouth daily.    Marland Kitchen. omeprazole (PRILOSEC) 20 MG capsule TAKE ONE CAPSULE BY MOUTH ONCE DAILY 30 capsule 2  . triamterene-hydrochlorothiazide (MAXZIDE-25) 37.5-25 MG tablet     . acetaminophen (TYLENOL) 500 MG tablet Take 1,000 mg by mouth every 6 (six) hours as needed for mild pain or headache.     . diphenhydrAMINE (BENADRYL ALLERGY) 25 mg capsule Take 25 mg by mouth every 6 (six) hours as needed (allergies).     . polyethylene glycol-electrolytes (NULYTELY/GOLYTELY) 420 g solution Take 4,000 mLs by mouth  once. (Patient not taking: Reported on 02/15/2016) 4000 mL 0   No current facility-administered medications for this visit.     Allergies as of 02/15/2016 - Review Complete 02/15/2016  Allergen Reaction Noted  . Ciprofloxacin hcl Hives and Shortness Of Breath 07/27/2011    Family History  Problem Relation Age of Onset  . Colon cancer Neg Hx     Social History   Social History  . Marital status: Single    Spouse name: N/A  . Number of children: N/A  . Years of education: N/A   Social History Main Topics  . Smoking status: Never Smoker  . Smokeless tobacco: Never Used  . Alcohol use Yes     Comment: socially  . Drug use: No  . Sexual activity: Not Currently   Other Topics Concern  . None   Social History Narrative  . None    Review of Systems: As mentioned in HPI   Physical Exam: BP (!) 160/97   Pulse 79   Temp 97.6 F (36.4 C) (Oral)   Ht 5\' 3"  (1.6 m)   Wt 225 lb 6.4 oz (102.2 kg)   BMI 39.93 kg/m  General:   Alert and oriented. No distress noted. Pleasant and cooperative.  Head:  Normocephalic and atraumatic. Eyes:  Conjuctiva clear without scleral icterus. Abdomen:  +BS, soft, non-tender and non-distended. No rebound or guarding. No HSM or masses noted. Msk:  Symmetrical without gross deformities. Normal posture. Extremities:  Without edema. Neurologic:  Alert and  oriented x4;  grossly normal neurologically. Psych:  Alert and cooperative. Normal mood and affect.

## 2016-02-15 NOTE — Assessment & Plan Note (Signed)
50 year old female with rectal bleeding in the setting of intermittent hard stool. Linzess. Known internal hemorrhoids on recent colonoscopy. Add supplemental fiber, behavior modification, avoid straining and prolonged toilet time, add anusol cream, and return for hemorrhoid banding in near future with Dr. Jena Gaussourk.

## 2016-02-15 NOTE — Patient Instructions (Addendum)
Use the Anusol cream twice a day for 7 days.   Start taking Benefiber or Metamucil daily.   Continue the Linzess daily.   We have scheduled you for a hemorrhoid banding with Dr. Jena Gaussourk in the near future.   Hemorrhoids Hemorrhoids are swollen veins around the rectum or anus. There are two types of hemorrhoids:   Internal hemorrhoids. These occur in the veins just inside the rectum. They may poke through to the outside and become irritated and painful.  External hemorrhoids. These occur in the veins outside the anus and can be felt as a painful swelling or hard lump near the anus. CAUSES  Pregnancy.   Obesity.   Constipation or diarrhea.   Straining to have a bowel movement.   Sitting for long periods on the toilet.  Heavy lifting or other activity that caused you to strain.  Anal intercourse. SYMPTOMS   Pain.   Anal itching or irritation.   Rectal bleeding.   Fecal leakage.   Anal swelling.   One or more lumps around the anus.  DIAGNOSIS  Your caregiver may be able to diagnose hemorrhoids by visual examination. Other examinations or tests that may be performed include:   Examination of the rectal area with a gloved hand (digital rectal exam).   Examination of anal canal using a small tube (scope).   A blood test if you have lost a significant amount of blood.  A test to look inside the colon (sigmoidoscopy or colonoscopy). TREATMENT Most hemorrhoids can be treated at home. However, if symptoms do not seem to be getting better or if you have a lot of rectal bleeding, your caregiver may perform a procedure to help make the hemorrhoids get smaller or remove them completely. Possible treatments include:   Placing a rubber band at the base of the hemorrhoid to cut off the circulation (rubber band ligation).   Injecting a chemical to shrink the hemorrhoid (sclerotherapy).   Using a tool to burn the hemorrhoid (infrared light therapy).   Surgically  removing the hemorrhoid (hemorrhoidectomy).   Stapling the hemorrhoid to block blood flow to the tissue (hemorrhoid stapling).  HOME CARE INSTRUCTIONS   Eat foods with fiber, such as whole grains, beans, nuts, fruits, and vegetables. Ask your doctor about taking products with added fiber in them (fibersupplements).  Increase fluid intake. Drink enough water and fluids to keep your urine clear or pale yellow.   Exercise regularly.   Go to the bathroom when you have the urge to have a bowel movement. Do not wait.   Avoid straining to have bowel movements.   Keep the anal area dry and clean. Use wet toilet paper or moist towelettes after a bowel movement.   Medicated creams and suppositories may be used or applied as directed.   Only take over-the-counter or prescription medicines as directed by your caregiver.   Take warm sitz baths for 15-20 minutes, 3-4 times a day to ease pain and discomfort.   Place ice packs on the hemorrhoids if they are tender and swollen. Using ice packs between sitz baths may be helpful.   Put ice in a plastic bag.   Place a towel between your skin and the bag.   Leave the ice on for 15-20 minutes, 3-4 times a day.   Do not use a donut-shaped pillow or sit on the toilet for long periods. This increases blood pooling and pain.  SEEK MEDICAL CARE IF:  You have increasing pain and swelling that is  not controlled by treatment or medicine.  You have uncontrolled bleeding.  You have difficulty or you are unable to have a bowel movement.  You have pain or inflammation outside the area of the hemorrhoids. MAKE SURE YOU:  Understand these instructions.  Will watch your condition.  Will get help right away if you are not doing well or get worse.   This information is not intended to replace advice given to you by your health care provider. Make sure you discuss any questions you have with your health care provider.   Document Released:  03/30/2000 Document Revised: 03/19/2012 Document Reviewed: 02/05/2012 Elsevier Interactive Patient Education [employer]2016 Elsevier Inc.

## 2016-02-20 NOTE — Progress Notes (Signed)
CC'D TO PCP °

## 2016-03-06 ENCOUNTER — Ambulatory Visit: Payer: Self-pay | Admitting: Internal Medicine

## 2016-03-06 ENCOUNTER — Encounter: Payer: Self-pay | Admitting: Internal Medicine

## 2016-03-13 ENCOUNTER — Encounter: Payer: Self-pay | Admitting: Internal Medicine

## 2016-03-14 ENCOUNTER — Encounter: Payer: Self-pay | Admitting: Internal Medicine

## 2016-04-17 ENCOUNTER — Ambulatory Visit (INDEPENDENT_AMBULATORY_CARE_PROVIDER_SITE_OTHER): Payer: Commercial Managed Care - HMO | Admitting: Internal Medicine

## 2016-04-17 ENCOUNTER — Encounter: Payer: Self-pay | Admitting: Internal Medicine

## 2016-04-17 VITALS — BP 126/79 | HR 67 | Temp 98.1°F | Ht 63.0 in | Wt 231.2 lb

## 2016-04-17 DIAGNOSIS — K5909 Other constipation: Secondary | ICD-10-CM | POA: Diagnosis not present

## 2016-04-17 DIAGNOSIS — K648 Other hemorrhoids: Secondary | ICD-10-CM | POA: Diagnosis not present

## 2016-04-17 NOTE — Progress Notes (Signed)
[facility] banding procedure note:  The patient presents with symptomatic grade 2/3 hemorrhoids, unresponsive to maximal medical therapy, requesting rubber band ligation of her hemorrhoidal disease. All risks, benefits, and alternative forms of therapy were described and informed consent was obtained.  Constipation continues to be an issue although Linzess 72 has helped tremendously. Utilizing Benefiber caused diarrhea. Still passes some hard "balls" in the evening. Is not on a stool softener.  Spending 15+ minutes on the commode. In the left lateral decubitus position , DRE, utilizing 0.125% nitroglycerin and 2% Xylocaine cream as lubricant, revealed multiple grade 3 hemorrhoid tags only. Anoscopy demonstrated a particularly prominent right anterior hemorrhoid column. Otherwise negative.  The decision was made to band the right anterior internal hemorrhoid and [facility] was used to perform band ligation without complication. Digital anorectal examination was then performed to assure proper positioning of the band, and to adjust the banded tissue as required.  Dietary and behavioral recommendations were given and (if necessary prescriptions were given), along with follow-up instructions. The patient will return in 4 weeks for followup and possible additional banding as required.  See discharge instructions. Will add Colace to the regimen.  No complications were encountered and the patient tolerated the procedure well.

## 2016-04-17 NOTE — Patient Instructions (Signed)
Avoid straining.  Limit toilet time to 5 minutes  Begin Colace (stool softener) 100 mg daily  Continued Linzess 72 daily  Call with any interim problems  Schedule followup appointment in 4 weeks from now

## 2016-04-20 ENCOUNTER — Ambulatory Visit: Payer: Self-pay | Admitting: Internal Medicine

## 2016-05-22 ENCOUNTER — Ambulatory Visit: Payer: Self-pay | Admitting: Internal Medicine

## 2016-05-22 ENCOUNTER — Encounter: Payer: Commercial Managed Care - HMO | Admitting: Internal Medicine

## 2016-07-10 ENCOUNTER — Other Ambulatory Visit: Payer: Self-pay | Admitting: Nurse Practitioner

## 2016-07-27 ENCOUNTER — Ambulatory Visit: Payer: Commercial Managed Care - HMO | Admitting: Internal Medicine

## 2016-08-28 ENCOUNTER — Ambulatory Visit (INDEPENDENT_AMBULATORY_CARE_PROVIDER_SITE_OTHER): Payer: Commercial Managed Care - HMO | Admitting: Internal Medicine

## 2016-08-28 ENCOUNTER — Encounter: Payer: Self-pay | Admitting: Internal Medicine

## 2016-08-28 VITALS — BP 130/85 | HR 73 | Temp 98.7°F | Ht 63.0 in | Wt 228.0 lb

## 2016-08-28 DIAGNOSIS — K648 Other hemorrhoids: Secondary | ICD-10-CM

## 2016-08-28 DIAGNOSIS — K5909 Other constipation: Secondary | ICD-10-CM | POA: Diagnosis not present

## 2016-08-28 NOTE — Patient Instructions (Signed)
Avoid straining.  Benefiber 1 tablespoon twice daily  Limit toilet time to 5 minutes  Continue Colace daily  Continue Linzess daily.  Call with any interim problems  Schedule followup appointment in 4 weeks from now

## 2016-08-28 NOTE — Progress Notes (Addendum)
[facility] banding procedure note:  The patient presents with symptomatic grade 3 hemorrhoids;  status post banding of the right anterior hemorrhoid previously. Did well until she got home had some pain for couple days. Then subsided. Has some bleeding as well. Did not call anyone. Constipation doing much better linzess/Colace. She desires a second band today.  All risks, benefits, and alternative forms of therapy were described and informed consent was obtained.  In the left lateral decubitus position, DRE, using nitroglycerin and Xylocaine ointment as lubricant, revealed 2 broad-based grade 3 hemorrhoids emanating from the right side.   The decision was made to band the left lateral internal hemorrhoid and the [facility] was used to perform band ligation without complication. Digital anorectal examination was then performed to assure proper positioning of the band and to adjust the banded tissue as required. Band found to be in excellent position. I elected to place an adjacent band on the left side. This was done without difficulty. No pinching or pain.  Bands found to be in excellent position on palpation. The patient was discharged home without pain or other issues. Dietary and behavioral recommendations were given along with follow-up instructions. The patient will return in 4 weeks for followup and possible additional banding as required.  No complications were encountered and the patient tolerated the procedure well.

## 2016-08-29 ENCOUNTER — Encounter: Payer: Self-pay | Admitting: Emergency Medicine

## 2016-08-29 ENCOUNTER — Ambulatory Visit (INDEPENDENT_AMBULATORY_CARE_PROVIDER_SITE_OTHER): Payer: Commercial Managed Care - HMO | Admitting: Emergency Medicine

## 2016-08-29 VITALS — BP 135/88 | HR 78 | Temp 98.6°F | Resp 18 | Ht 63.0 in | Wt 228.4 lb

## 2016-08-29 DIAGNOSIS — R42 Dizziness and giddiness: Secondary | ICD-10-CM | POA: Diagnosis not present

## 2016-08-29 DIAGNOSIS — R350 Frequency of micturition: Secondary | ICD-10-CM

## 2016-08-29 DIAGNOSIS — B349 Viral infection, unspecified: Secondary | ICD-10-CM | POA: Diagnosis not present

## 2016-08-29 DIAGNOSIS — R531 Weakness: Secondary | ICD-10-CM

## 2016-08-29 LAB — POCT CBC
Granulocyte percent: 35.7 %G — AB (ref 37–80)
HCT, POC: 37 % — AB (ref 37.7–47.9)
Hemoglobin: 12.6 g/dL (ref 12.2–16.2)
LYMPH, POC: 2.4 (ref 0.6–3.4)
MCH, POC: 31.1 pg (ref 27–31.2)
MCHC: 34 g/dL (ref 31.8–35.4)
MCV: 91.3 fL (ref 80–97)
MID (cbc): 0.4 (ref 0–0.9)
MPV: 7.1 fL (ref 0–99.8)
PLATELET COUNT, POC: 297 10*3/uL (ref 142–424)
POC Granulocyte: 1.5 — AB (ref 2–6.9)
POC LYMPH %: 56 % — AB (ref 10–50)
POC MID %: 8.3 %M (ref 0–12)
RBC: 4.05 M/uL (ref 4.04–5.48)
RDW, POC: 14.4 %
WBC: 4.3 10*3/uL — AB (ref 4.6–10.2)

## 2016-08-29 LAB — POCT URINALYSIS DIP (MANUAL ENTRY)
Bilirubin, UA: NEGATIVE
Blood, UA: NEGATIVE
Glucose, UA: NEGATIVE mg/dL
Ketones, POC UA: NEGATIVE mg/dL
LEUKOCYTES UA: NEGATIVE
NITRITE UA: NEGATIVE
PROTEIN UA: NEGATIVE mg/dL
SPEC GRAV UA: 1.01 (ref 1.010–1.025)
UROBILINOGEN UA: 1 U/dL
pH, UA: 6 (ref 5.0–8.0)

## 2016-08-29 LAB — POC MICROSCOPIC URINALYSIS (UMFC): MUCUS RE: ABSENT

## 2016-08-29 LAB — POCT GLYCOSYLATED HEMOGLOBIN (HGB A1C): HEMOGLOBIN A1C: 6.2

## 2016-08-29 MED ORDER — SULFAMETHOXAZOLE-TRIMETHOPRIM 800-160 MG PO TABS: 1.0000 | ORAL_TABLET | Freq: Two times a day (BID) | ORAL | 0 refills | Status: AC

## 2016-08-29 NOTE — Patient Instructions (Addendum)
   IF you received an x-ray today, you will receive an invoice from [facility]. Please contact [facility] at [phone] with questions or concerns regarding your invoice.   IF you received labwork today, you will receive an invoice from [facility]. Please contact [facility] at [phone] with questions or concerns regarding your invoice.   Our billing staff will not be able to assist you with questions regarding bills from these companies.  You will be contacted with the lab results as soon as they are available. The fastest way to get your results is to activate your My Chart account. Instructions are located on the last page of this paperwork. If you have not heard from us regarding the results in 2 weeks, please contact this office.     Viral Illness, Adult Viruses are tiny germs that can get into a person's body and cause illness. There are many different types of viruses, and they cause many types of illness. Viral illnesses can range from mild to severe. They can affect various parts of the body. Common illnesses that are caused by a virus include colds and the flu. Viral illnesses also include serious conditions such as HIV/AIDS (human immunodeficiency virus/acquired immunodeficiency syndrome). A few viruses have been linked to certain cancers. What are the causes? Many types of viruses can cause illness. Viruses invade cells in your body, multiply, and cause the infected cells to malfunction or die. When the cell dies, it releases more of the virus. When this happens, you develop symptoms of the illness, and the virus continues to spread to other cells. If the virus takes over the function of the cell, it can cause the cell to divide and grow out of control, as is the case when a virus causes cancer. Different viruses get into the body in different ways. You can get a virus by:  Swallowing food or water that is contaminated with the virus.  Breathing in droplets  that have been coughed or sneezed into the air by an infected person.  Touching a surface that has been contaminated with the virus and then touching your eyes, nose, or mouth.  Being bitten by an insect or animal that carries the virus.  Having sexual contact with a person who is infected with the virus.  Being exposed to blood or fluids that contain the virus, either through an open cut or during a transfusion. If a virus enters your body, your body's defense system (immune system) will try to fight the virus. You may be at higher risk for a viral illness if your immune system is weak. What are the signs or symptoms? Symptoms vary depending on the type of virus and the location of the cells that it invades. Common symptoms of the main types of viral illnesses include: Cold and flu viruses   Fever.  Headache.  Sore throat.  Muscle aches.  Nasal congestion.  Cough. Digestive system (gastrointestinal) viruses   Fever.  Abdominal pain.  Nausea.  Diarrhea. Liver viruses (hepatitis)   Loss of appetite.  Tiredness.  Yellowing of the skin (jaundice). Brain and spinal cord viruses   Fever.  Headache.  Stiff neck.  Nausea and vomiting.  Confusion or sleepiness. Skin viruses   Warts.  Itching.  Rash. Sexually transmitted viruses   Discharge.  Swelling.  Redness.  Rash. How is this treated? Viruses can be difficult to treat because they live within cells. Antibiotic medicines do not treat viruses because these drugs do not get inside cells.   Treatment for a viral illness may include:  Resting and drinking plenty of fluids.  Medicines to relieve symptoms. These can include over-the-counter medicine for pain and fever, medicines for cough or congestion, and medicines to relieve diarrhea.  Antiviral medicines. These drugs are available only for certain types of viruses. They may help reduce flu symptoms if taken early. There are also many antiviral  medicines for hepatitis and HIV/AIDS. Some viral illnesses can be prevented with vaccinations. A common example is the flu shot. Follow these instructions at home: Medicines    Take over-the-counter and prescription medicines only as told by your health care provider.  If you were prescribed an antiviral medicine, take it as told by your health care provider. Do not stop taking the medicine even if you start to feel better.  Be aware of when antibiotics are needed and when they are not needed. Antibiotics do not treat viruses. If your health care provider thinks that you may have a bacterial infection as well as a viral infection, you may get an antibiotic.  Do not ask for an antibiotic prescription if you have been diagnosed with a viral illness. That will not make your illness go away faster.  Frequently taking antibiotics when they are not needed can lead to antibiotic resistance. When this develops, the medicine no longer works against the bacteria that it normally fights. General instructions   Drink enough fluids to keep your urine clear or pale yellow.  Rest as much as possible.  Return to your normal activities as told by your health care provider. Ask your health care provider what activities are safe for you.  Keep all follow-up visits as told by your health care provider. This is important. How is this prevented? Take these actions to reduce your risk of viral infection:  Eat a healthy diet and get enough rest.  Wash your hands often with soap and water. This is especially important when you are in public places. If soap and water are not available, use hand sanitizer.  Avoid close contact with friends and family who have a viral illness.  If you travel to areas where viral gastrointestinal infection is common, avoid drinking water or eating raw food.  Keep your immunizations up to date. Get a flu shot every year as told by your health care provider.  Do not share  toothbrushes, nail clippers, razors, or needles with other people.  Always practice safe sex. Contact a health care provider if:  You have symptoms of a viral illness that do not go away.  Your symptoms come back after going away.  Your symptoms get worse. Get help right away if:  You have trouble breathing.  You have a severe headache or a stiff neck.  You have severe vomiting or abdominal pain. This information is not intended to replace advice given to you by your health care provider. Make sure you discuss any questions you have with your health care provider. Document Released: 08/12/2015 Document Revised: 09/14/2015 Document Reviewed: 08/12/2015 Elsevier Interactive Patient Education  2017 Elsevier Inc.   

## 2016-08-29 NOTE — Progress Notes (Signed)
Leslie Rodriguez 51 y.o.   Chief Complaint  Patient presents with  . Urinary Frequency    Symptoms began yesterday   . Polydipsia  . Blurred Vision  . Nausea    HISTORY OF PRESENT ILLNESS: This is a 51 y.o. female complaining of feeling weak and lightheaded since last night; feels dizzy and nauseous; had chills. Also c/o urinary frequency and bladder pressure. People at work sick with same.  HPI   Prior to Admission medications   Medication Sig Start Date End Date Taking? Authorizing Provider  docusate sodium (COLACE) 100 MG capsule Take 100 mg by mouth daily as needed for mild constipation.   Yes [provider]  hydrocortisone (ANUSOL-HC) 2.5 % rectal cream Place 1 application rectally 2 (two) times daily. Patient taking differently: Place 1 application rectally as needed.  02/15/16  Yes Gelene MinkBoone, Anna W, NP  linaclotide [facility](LINZESS) 72 MCG capsule Take 1 capsule (72 mcg total) by mouth daily before breakfast. 11/16/15  Yes Gelene MinkBoone, Anna W, NP  metoprolol succinate (TOPROL-XL) 100 MG 24 hr tablet Take 100 mg by mouth daily. 07/08/14  Yes [provider]  omeprazole (PRILOSEC) 20 MG capsule TAKE ONE CAPSULE BY MOUTH ONCE DAILY 07/10/16  Yes Anice PaganiniGill, Eric A, NP  phentermine (ADIPEX-P) 37.5 MG tablet Take 1 tablet by mouth daily. Pt unsure of current dose 08/15/16  Yes [provider]  triamterene-hydrochlorothiazide (MAXZIDE-25) 37.5-25 MG tablet Take 1 tablet by mouth daily.  08/10/15  Yes [provider]  levothyroxine (SYNTHROID, LEVOTHROID) 50 MCG tablet Take 50 mcg by mouth daily before breakfast.  08/29/15   [provider]    Allergies  Allergen Reactions  . Ciprofloxacin Hcl Hives and Shortness Of Breath    Patient Active Problem List   Diagnosis Date Noted  . Rectal bleeding 02/15/2016  . Colon cancer screening   . Diverticulosis of colon without hemorrhage   . Nausea with vomiting 09/14/2015  . Abdominal pain 09/14/2015  . GERD  (gastroesophageal reflux disease) 09/14/2015  . Encounter for screening colonoscopy 09/14/2015  . Chest wall pain 04/10/2013  . Viral bronchitis 04/10/2013  . Depression, major, recurrent, moderate (HCC) 04/15/2012    Past Medical History:  Diagnosis Date  . Anxiety   . Depression   . Hypertension     Past Surgical History:  Procedure Laterality Date  . ABDOMINAL HYSTERECTOMY  2003  . CESAREAN SECTION  1985  . COLONOSCOPY  01/16/2006   RMR: normal rectum/left sided transverse diverticula  . COLONOSCOPY N/A 09/21/2015   Dr. Jena Gaussourk: internal hemorrhoids, diverticulosis     Social History   Social History  . Marital status: Single    Spouse name: N/A  . Number of children: N/A  . Years of education: N/A   Occupational History  . Not on file.   Social History Main Topics  . Smoking status: Never Smoker  . Smokeless tobacco: Never Used  . Alcohol use Yes     Comment: socially  . Drug use: No  . Sexual activity: Not Currently   Other Topics Concern  . Not on file   Social History Narrative  . No narrative on file    Family History  Problem Relation Age of Onset  . Colon cancer Neg Hx      Review of Systems  Constitutional: Positive for chills and malaise/fatigue. Negative for fever.  HENT: Negative for congestion, nosebleeds, sinus pain and sore throat.   Eyes: Positive for blurred vision. Negative for double vision, discharge  and redness.  Respiratory: Negative for cough and shortness of breath.   Cardiovascular: Negative for chest pain and palpitations.  Gastrointestinal: Positive for nausea. Negative for abdominal pain, diarrhea and vomiting.  Genitourinary: Positive for frequency. Negative for dysuria and hematuria.  Musculoskeletal: Negative for myalgias and neck pain.  Skin: Negative.  Negative for rash.  Neurological: Positive for dizziness and weakness. Negative for speech change, focal weakness, loss of consciousness and headaches.    Endo/Heme/Allergies: Positive for polydipsia.  All other systems reviewed and are negative.  Vitals:   08/29/16 1010  BP: 135/88  Pulse: 78  Resp: 18  Temp: 98.6 F (37 C)     Physical Exam  Constitutional: She is oriented to person, place, and time. She appears well-developed and well-nourished.  HENT:  Head: Normocephalic and atraumatic.  Nose: Nose normal.  Mouth/Throat: Oropharynx is clear and moist. No oropharyngeal exudate.  Eyes: Conjunctivae and EOM are normal. Pupils are equal, round, and reactive to light.  Neck: Normal range of motion. Neck supple. No JVD present. Carotid bruit is not present. No thyromegaly present.  Cardiovascular: Normal rate, regular rhythm, normal heart sounds and intact distal pulses.   Pulmonary/Chest: Effort normal and breath sounds normal.  Abdominal: Soft. Bowel sounds are normal. There is no tenderness.  Musculoskeletal: Normal range of motion.  Lymphadenopathy:    She has no cervical adenopathy.  Neurological: She is alert and oriented to person, place, and time. No sensory deficit. She exhibits normal muscle tone.  Skin: Skin is warm and dry. Capillary refill takes less than 2 seconds. No rash noted.  Psychiatric: She has a normal mood and affect. Her behavior is normal.  Vitals reviewed.  Results for orders placed or performed in visit on 08/29/16 (from the past 24 hour(s))  POCT CBC     Status: Abnormal   Collection Time: 08/29/16 11:00 AM  Result Value Ref Range   WBC 4.3 (A) 4.6 - 10.2 K/uL   Lymph, poc 2.4 0.6 - 3.4   POC LYMPH PERCENT 56.0 (A) 10 - 50 %L   MID (cbc) 0.4 0 - 0.9   POC MID % 8.3 0 - 12 %M   POC Granulocyte 1.5 (A) 2 - 6.9   Granulocyte percent 35.7 (A) 37 - 80 %G   RBC 4.05 4.04 - 5.48 M/uL   Hemoglobin 12.6 12.2 - 16.2 g/dL   HCT, POC 16.1 (A) 09.6 - 47.9 %   MCV 91.3 80 - 97 fL   MCH, POC 31.1 27 - 31.2 pg   MCHC 34.0 31.8 - 35.4 g/dL   RDW, POC 04.5 %   Platelet Count, POC 297 142 - 424 K/uL   MPV  7.1 0 - 99.8 fL  POCT urinalysis dipstick     Status: None   Collection Time: 08/29/16 11:01 AM  Result Value Ref Range   Color, UA yellow yellow   Clarity, UA clear clear   Glucose, UA negative negative mg/dL   Bilirubin, UA negative negative   Ketones, POC UA negative negative mg/dL   Spec Grav, UA 4.098 1.191 - 1.025   Blood, UA negative negative   pH, UA 6.0 5.0 - 8.0   Protein Ur, POC negative negative mg/dL   Urobilinogen, UA 1.0 0.2 or 1.0 E.U./dL   Nitrite, UA Negative Negative   Leukocytes, UA Negative Negative  POCT glycosylated hemoglobin (Hb A1C)     Status: None   Collection Time: 08/29/16 11:05 AM  Result Value Ref Range  Hemoglobin A1C 6.2   POCT Microscopic Urinalysis ([facility])     Status: Abnormal   Collection Time: 08/29/16 11:14 AM  Result Value Ref Range   WBC,UR,HPF,POC None None WBC/hpf   RBC,UR,HPF,POC None None RBC/hpf   Bacteria Few (A) None, Too numerous to count   Mucus Absent Absent   Epithelial Cells, UR Per Microscopy Moderate (A) None, Too numerous to count cells/hpf     ASSESSMENT & PLAN: Leslie Rodriguez was seen today for urinary frequency, polydipsia, blurred vision and nausea.  Diagnoses and all orders for this visit:  Frequency of urination Comments: suspected UTI Orders: -     Cancel: POCT urinalysis dipstick -     POCT Microscopic Urinalysis ([facility]) -     POCT CBC -     POCT urinalysis dipstick -     Urine culture -     sulfamethoxazole-trimethoprim (BACTRIM DS,SEPTRA DS) 800-160 MG tablet; Take 1 tablet by mouth 2 (two) times daily.  General weakness -     POCT glycosylated hemoglobin (Hb A1C)  Intermittent lightheadedness  Viral illness    Patient Instructions       IF you received an x-ray today, you will receive an invoice from [facility]. Please contact [facility] at [phone] with questions or concerns regarding your invoice.   IF you received labwork today, you will receive an invoice from [facility].  Please contact [facility] at [phone] with questions or concerns regarding your invoice.   Our billing staff will not be able to assist you with questions regarding bills from these companies.  You will be contacted with the lab results as soon as they are available. The fastest way to get your results is to activate your My Chart account. Instructions are located on the last page of this paperwork. If you have not heard from [place] regarding the results in 2 weeks, please contact this office.      Viral Illness, Adult Viruses are tiny germs that can get into a person's body and cause illness. There are many different types of viruses, and they cause many types of illness. Viral illnesses can range from mild to severe. They can affect various parts of the body. Common illnesses that are caused by a virus include colds and the flu. Viral illnesses also include serious conditions such as HIV/AIDS (human immunodeficiency virus/acquired immunodeficiency syndrome). A few viruses have been linked to certain cancers. What are the causes? Many types of viruses can cause illness. Viruses invade cells in your body, multiply, and cause the infected cells to malfunction or die. When the cell dies, it releases more of the virus. When this happens, you develop symptoms of the illness, and the virus continues to spread to other cells. If the virus takes over the function of the cell, it can cause the cell to divide and grow out of control, as is the case when a virus causes cancer. Different viruses get into the body in different ways. You can get a virus by:  Swallowing food or water that is contaminated with the virus.  Breathing in droplets that have been coughed or sneezed into the air by an infected person.  Touching a surface that has been contaminated with the virus and then touching your eyes, nose, or mouth.  Being bitten by an insect or animal that carries the virus.  Having sexual contact with a  person who is infected with the virus.  Being exposed to blood or fluids that contain the virus, either through an  open cut or during a transfusion. If a virus enters your body, your body's defense system (immune system) will try to fight the virus. You may be at higher risk for a viral illness if your immune system is weak. What are the signs or symptoms? Symptoms vary depending on the type of virus and the location of the cells that it invades. Common symptoms of the main types of viral illnesses include: Cold and flu viruses   Fever.  Headache.  Sore throat.  Muscle aches.  Nasal congestion.  Cough. Digestive system (gastrointestinal) viruses   Fever.  Abdominal pain.  Nausea.  Diarrhea. Liver viruses (hepatitis)   Loss of appetite.  Tiredness.  Yellowing of the skin (jaundice). Brain and spinal cord viruses   Fever.  Headache.  Stiff neck.  Nausea and vomiting.  Confusion or sleepiness. Skin viruses   Warts.  Itching.  Rash. Sexually transmitted viruses   Discharge.  Swelling.  Redness.  Rash. How is this treated? Viruses can be difficult to treat because they live within cells. Antibiotic medicines do not treat viruses because these drugs do not get inside cells. Treatment for a viral illness may include:  Resting and drinking plenty of fluids.  Medicines to relieve symptoms. These can include over-the-counter medicine for pain and fever, medicines for cough or congestion, and medicines to relieve diarrhea.  Antiviral medicines. These drugs are available only for certain types of viruses. They may help reduce flu symptoms if taken early. There are also many antiviral medicines for hepatitis and HIV/AIDS. Some viral illnesses can be prevented with vaccinations. A common example is the flu shot. Follow these instructions at home: Medicines    Take over-the-counter and prescription medicines only as told by your health care provider.  If  you were prescribed an antiviral medicine, take it as told by your health care provider. Do not stop taking the medicine even if you start to feel better.  Be aware of when antibiotics are needed and when they are not needed. Antibiotics do not treat viruses. If your health care provider thinks that you may have a bacterial infection as well as a viral infection, you may get an antibiotic.  Do not ask for an antibiotic prescription if you have been diagnosed with a viral illness. That will not make your illness go away faster.  Frequently taking antibiotics when they are not needed can lead to antibiotic resistance. When this develops, the medicine no longer works against the bacteria that it normally fights. General instructions   Drink enough fluids to keep your urine clear or pale yellow.  Rest as much as possible.  Return to your normal activities as told by your health care provider. Ask your health care provider what activities are safe for you.  Keep all follow-up visits as told by your health care provider. This is important. How is this prevented? Take these actions to reduce your risk of viral infection:  Eat a healthy diet and get enough rest.  Wash your hands often with soap and water. This is especially important when you are in public places. If soap and water are not available, use hand sanitizer.  Avoid close contact with friends and family who have a viral illness.  If you travel to areas where viral gastrointestinal infection is common, avoid drinking water or eating raw food.  Keep your immunizations up to date. Get a flu shot every year as told by your health care provider.  Do not share toothbrushes, nail  clippers, razors, or needles with other people.  Always practice safe sex. Contact a health care provider if:  You have symptoms of a viral illness that do not go away.  Your symptoms come back after going away.  Your symptoms get worse. Get help right  away if:  You have trouble breathing.  You have a severe headache or a stiff neck.  You have severe vomiting or abdominal pain. This information is not intended to replace advice given to you by your health care provider. Make sure you discuss any questions you have with your health care provider. Document Released: 08/12/2015 Document Revised: 09/14/2015 Document Reviewed: 08/12/2015 Elsevier Interactive Patient Education  2017 Elsevier Inc.      Edwina Barth, MD [facility]

## 2016-09-03 LAB — URINE CULTURE

## 2016-09-04 ENCOUNTER — Encounter: Payer: Self-pay | Admitting: Radiology

## 2016-10-16 ENCOUNTER — Ambulatory Visit: Payer: Commercial Managed Care - HMO | Admitting: Internal Medicine

## 2016-10-22 ENCOUNTER — Encounter: Payer: Self-pay | Admitting: Internal Medicine

## 2016-12-03 ENCOUNTER — Other Ambulatory Visit: Payer: Self-pay | Admitting: Nurse Practitioner

## 2016-12-07 ENCOUNTER — Encounter: Payer: Commercial Managed Care - HMO | Admitting: Internal Medicine

## 2016-12-11 ENCOUNTER — Encounter: Payer: 59 | Admitting: Internal Medicine

## 2017-01-01 ENCOUNTER — Encounter: Payer: 59 | Admitting: Internal Medicine

## 2017-03-04 ENCOUNTER — Emergency Department (HOSPITAL_COMMUNITY)
Admission: EM | Admit: 2017-03-04 | Discharge: 2017-03-04 | Disposition: A | Discharge status: Home / Self Care | Payer: 59 | Attending: Emergency Medicine | Admitting: Emergency Medicine

## 2017-03-04 ENCOUNTER — Encounter (HOSPITAL_COMMUNITY): Payer: Self-pay | Admitting: Emergency Medicine

## 2017-03-04 DIAGNOSIS — R110 Nausea: Secondary | ICD-10-CM | POA: Diagnosis not present

## 2017-03-04 DIAGNOSIS — M545 Low back pain, unspecified: Secondary | ICD-10-CM

## 2017-03-04 DIAGNOSIS — Z79899 Other long term (current) drug therapy: Secondary | ICD-10-CM | POA: Insufficient documentation

## 2017-03-04 DIAGNOSIS — I10 Essential (primary) hypertension: Secondary | ICD-10-CM | POA: Diagnosis not present

## 2017-03-04 DIAGNOSIS — R0789 Other chest pain: Secondary | ICD-10-CM | POA: Insufficient documentation

## 2017-03-04 LAB — URINALYSIS, ROUTINE W REFLEX MICROSCOPIC
Bilirubin Urine: NEGATIVE
Glucose, UA: NEGATIVE mg/dL
Hgb urine dipstick: NEGATIVE
Ketones, ur: NEGATIVE mg/dL
LEUKOCYTES UA: NEGATIVE
NITRITE: NEGATIVE
PH: 5 (ref 5.0–8.0)
Protein, ur: NEGATIVE mg/dL
SPECIFIC GRAVITY, URINE: 1.032 — AB (ref 1.005–1.030)

## 2017-03-04 LAB — I-STAT TROPONIN, ED: Troponin i, poc: 0 ng/mL (ref 0.00–0.08)

## 2017-03-04 LAB — COMPREHENSIVE METABOLIC PANEL
ALBUMIN: 3.5 g/dL (ref 3.5–5.0)
ALT: 13 U/L — ABNORMAL LOW (ref 14–54)
ANION GAP: 8 (ref 5–15)
AST: 18 U/L (ref 15–41)
Alkaline Phosphatase: 59 U/L (ref 38–126)
BILIRUBIN TOTAL: 0.1 mg/dL — AB (ref 0.3–1.2)
BUN: 15 mg/dL (ref 6–20)
CHLORIDE: 103 mmol/L (ref 101–111)
CO2: 28 mmol/L (ref 22–32)
Calcium: 9 mg/dL (ref 8.9–10.3)
Creatinine, Ser: 1.07 mg/dL — ABNORMAL HIGH (ref 0.44–1.00)
GFR calc Af Amer: >60 mL/min (ref >60)
GFR, EST NON AFRICAN AMERICAN: 59 mL/min — AB (ref >60)
Glucose, Bld: 110 mg/dL — ABNORMAL HIGH (ref 65–99)
POTASSIUM: 3.6 mmol/L (ref 3.5–5.1)
Sodium: 139 mmol/L (ref 135–145)
TOTAL PROTEIN: 6.9 g/dL (ref 6.5–8.1)

## 2017-03-04 LAB — LIPASE, BLOOD: LIPASE: 25 U/L (ref 11–51)

## 2017-03-04 LAB — CBC
HEMATOCRIT: 38.4 % (ref 36.0–46.0)
HEMOGLOBIN: 12.3 g/dL (ref 12.0–15.0)
MCH: 30.6 pg (ref 26.0–34.0)
MCHC: 32 g/dL (ref 30.0–36.0)
MCV: 95.5 fL (ref 78.0–100.0)
Platelets: 324 10*3/uL (ref 150–400)
RBC: 4.02 MIL/uL (ref 3.87–5.11)
RDW: 14.2 % (ref 11.5–15.5)
WBC: 6.3 10*3/uL (ref 4.0–10.5)

## 2017-03-04 MED ORDER — SODIUM CHLORIDE 0.9 % IV BOLUS (SEPSIS)
1000.0000 mL | Freq: Once | INTRAVENOUS | Status: AC
  Administered 2017-03-04: 1000 mL via INTRAVENOUS

## 2017-03-04 MED ORDER — KETOROLAC TROMETHAMINE 30 MG/ML IJ SOLN
30.0000 mg | Freq: Once | INTRAMUSCULAR | Status: AC
  Administered 2017-03-04: 30 mg via INTRAVENOUS
  Filled 2017-03-04: qty 1

## 2017-03-04 MED ORDER — DIAZEPAM 5 MG PO TABS: 5.0000 mg | ORAL_TABLET | Freq: Two times a day (BID) | ORAL | 0 refills | Status: DC

## 2017-03-04 MED ORDER — OXYCODONE-ACETAMINOPHEN 5-325 MG PO TABS
1.0000 | ORAL_TABLET | ORAL | Status: DC | PRN
  Administered 2017-03-04: 1 via ORAL
  Filled 2017-03-04: qty 1

## 2017-03-04 MED ORDER — ONDANSETRON 4 MG PO TBDP
4.0000 mg | ORAL_TABLET | Freq: Once | ORAL | Status: AC | PRN
  Administered 2017-03-04: 4 mg via ORAL
  Filled 2017-03-04: qty 1

## 2017-03-04 MED ORDER — DIAZEPAM 5 MG PO TABS
5.0000 mg | ORAL_TABLET | Freq: Once | ORAL | Status: AC
  Administered 2017-03-04: 5 mg via ORAL
  Filled 2017-03-04: qty 1

## 2017-03-04 MED ORDER — OXYCODONE-ACETAMINOPHEN 5-325 MG PO TABS: 1.0000 | ORAL_TABLET | Freq: Four times a day (QID) | ORAL | 0 refills | Status: DC | PRN

## 2017-03-04 MED ORDER — NAPROXEN 500 MG PO TABS: 500.0000 mg | ORAL_TABLET | Freq: Two times a day (BID) | ORAL | 0 refills | Status: DC

## 2017-03-04 NOTE — ED Notes (Signed)
Pt ambulated to RR with steady gait

## 2017-03-04 NOTE — ED Notes (Signed)
Pt unable to void at this time. 

## 2017-03-04 NOTE — Discharge Instructions (Signed)
Return to the ED with any concerns including weakness of legs, not able to urinate, loss of control of bowel or bladder, fever/chills, decreased level of alertness/lethargy, or any other alarming symptoms °

## 2017-03-04 NOTE — ED Notes (Signed)
ED Provider at bedside. 

## 2017-03-04 NOTE — ED Provider Notes (Signed)
[facility] Provider Note   CSN: [id number] Arrival date & time: 03/04/17  0200     History   Chief Complaint Chief Complaint  Patient presents with  . Back Pain  . Chest Pain    HPI Leslie Rodriguez is a 51 y.o. female.  HPI  Pt presenting with c/o nausea as well as right lower back pain.  She states she felt some nausea yesterday and last night and then had sudden onset of right lower back pain.  Pain feels like a tightness in her low back.  She denies chest pain to me- states she felt short of breath due to the intensity of the back pain on arrival.  No weakness of legs, no radiation down to legs.  No dysuria or urinary frequency.  No fever/chills.  No vomiting.  zofran in triage helped with nausea, percocet did not help with pain.  She also tried ibuprofen at home.  Pain is worse with movement and certain positions.  There are no other associated systemic symptoms, there are no other alleviating or modifying factors.   Past Medical History:  Diagnosis Date  . Anxiety   . Depression   . Hypertension     Patient Active Problem List   Diagnosis Date Noted  . Frequency of urination 08/29/2016  . General weakness 08/29/2016  . Intermittent lightheadedness 08/29/2016  . Viral illness 08/29/2016  . Rectal bleeding 02/15/2016  . Colon cancer screening   . Diverticulosis of colon without hemorrhage   . Nausea with vomiting 09/14/2015  . Abdominal pain 09/14/2015  . GERD (gastroesophageal reflux disease) 09/14/2015  . Encounter for screening colonoscopy 09/14/2015  . Chest wall pain 04/10/2013  . Viral bronchitis 04/10/2013  . Depression, major, recurrent, moderate (HCC) 04/15/2012    Past Surgical History:  Procedure Laterality Date  . ABDOMINAL HYSTERECTOMY  2003  . CESAREAN SECTION  1985  . COLONOSCOPY  01/16/2006   RMR: normal rectum/left sided transverse diverticula  . COLONOSCOPY N/A 09/21/2015   Performed by Corbin Adeourk, Robert M,  MD at [facility]    OB History    No data available       Home Medications    Prior to Admission medications   Medication Sig Start Date End Date Taking? Authorizing Provider  diphenhydrAMINE (BENADRYL ALLERGY) 25 MG tablet Take 25 mg every 6 (six) hours as needed by mouth for allergies.   Yes [provider]  linaclotide (LINZESS) 145 MCG CAPS capsule Take 145 mcg daily before breakfast by mouth.   Yes [provider]  metoprolol succinate (TOPROL-XL) 100 MG 24 hr tablet Take 100 mg by mouth daily. 07/08/14  Yes [provider]  omeprazole (PRILOSEC) 20 MG capsule TAKE 1 CAPSULE BY MOUTH ONCE DAILY 12/04/16  Yes Anice PaganiniGill, Eric A, NP  oxymetazoline (AFRIN) 0.05 % nasal spray Place 1 spray daily as needed into both nostrils for congestion.   Yes [provider]  diazepam (VALIUM) 5 MG tablet Take 1 tablet (5 mg total) 2 (two) times daily by mouth. 03/04/17   Terrilee Dudzik, Latanya MaudlinMartha L, MD  hydrocortisone (ANUSOL-HC) 2.5 % rectal cream Place 1 application rectally 2 (two) times daily. Patient not taking: Reported on 03/04/2017 02/15/16   Gelene MinkBoone, Anna W, NP  linaclotide [facility](LINZESS) 72 MCG capsule Take 1 capsule (72 mcg total) by mouth daily before breakfast. Patient not taking: Reported on 03/04/2017 11/16/15   Gelene MinkBoone, Anna W, NP  naproxen (NAPROSYN) 500 MG tablet Take 1 tablet (  500 mg total) 2 (two) times daily by mouth. 03/04/17   Viann Nielson, Latanya MaudlinMartha L, MD  oxyCODONE-acetaminophen (PERCOCET/ROXICET) 5-325 MG tablet Take 1-2 tablets every 6 (six) hours as needed by mouth for severe pain. 03/04/17   MabeLatanya Maudlin, Tariq Pernell L, MD    Family History Family History  Problem Relation Age of Onset  . Colon cancer Neg Hx     Social History Social History   Tobacco Use  . Smoking status: Never Smoker  . Smokeless tobacco: Never Used  Substance Use Topics  . Alcohol use: Yes    Comment: socially  . Drug use: No     Allergies   Ciprofloxacin hcl   Review of Systems Review of Systems   ROS reviewed and all otherwise negative except for mentioned in HPI   Physical Exam Updated Vital Signs BP 103/76 (BP Location: Right Arm)   Pulse 61   Temp 97.7 F (36.5 C) (Oral)   Resp 16   Ht 5\' 3"  (1.6 m)   Wt 103.4 kg (228 lb)   SpO2 100%   BMI 40.39 kg/m  Vitals reivewed Physical Exam  Physical Examination: General appearance - alert, well appearing, and in no distress Mental status - alert, oriented to person, place, and time Eyes - no conjunctival injection, no scleral icterus Chest - clear to auscultation, no wheezes, rales or rhonchi, symmetric air entry Heart - normal rate, regular rhythm, normal S1, S2, no murmurs, rubs, clicks or gallops Abdomen - soft, nontender, nondistended, no masses or organomegaly Back exam -no midline tenderness to palpation, right lower lumbar paraspinal tenderness, no CVA tenderness Neurological - alert, oriented, normal speech, strength 5/5 in bilateral lower extremities Extremities - peripheral pulses normal, no pedal edema, no clubbing or cyanosis Skin - normal coloration and turgor, no rashes   ED Treatments / Results  Labs (all labs ordered are listed, but only abnormal results are displayed) Labs Reviewed  COMPREHENSIVE METABOLIC PANEL - Abnormal; Notable for the following components:      Result Value   Glucose, Bld 110 (*)    Creatinine, Ser 1.07 (*)    ALT 13 (*)    Total Bilirubin 0.1 (*)    GFR calc non Af Amer 59 (*)    All other components within normal limits  URINALYSIS, ROUTINE W REFLEX MICROSCOPIC - Abnormal; Notable for the following components:   APPearance CLOUDY (*)    Specific Gravity, Urine 1.032 (*)    All other components within normal limits  LIPASE, BLOOD  CBC  I-STAT TROPONIN, ED    EKG  EKG Interpretation  Date/Time:  Monday March 04 2017 02:13:13 EST Ventricular Rate:  69 PR Interval:  158 QRS Duration: 96 QT Interval:  436 QTC Calculation: 467 R Axis:   52 Text Interpretation:   Normal sinus rhythm RSR' or QR pattern in V1 suggests right ventricular conduction delay Borderline ECG No significant change since last tracing Confirmed by Jerelyn ScottLinker, Adrian Dinovo [phone]) on 03/04/2017 9:06:44 AM       Radiology No results found.  Procedures Procedures (including critical care time)  Medications Ordered in ED Medications  oxyCODONE-acetaminophen (PERCOCET/ROXICET) 5-325 MG per tablet 1 tablet (1 tablet Oral Given 03/04/17 0357)  ondansetron (ZOFRAN-ODT) disintegrating tablet 4 mg (4 mg Oral Given 03/04/17 0357)  ketorolac (TORADOL) 30 MG/ML injection 30 mg (30 mg Intravenous Given 03/04/17 1014)  diazepam (VALIUM) tablet 5 mg (5 mg Oral Given 03/04/17 1004)  sodium chloride 0.9 % bolus 1,000 mL (0 mLs Intravenous Stopped 03/04/17 1138)  Initial Impression / Assessment and Plan / ED Course  I have reviewed the triage vital signs and the nursing notes.  Pertinent labs & imaging results that were available during my care of the patient were reviewed by me and considered in my medical decision making (see chart for details).     Pt presenting with acute onset of right lower back pain as well as nausea.  No abdominal tenderness on exam.  No weakness of legs, no signs or symptoms of cauda equina, no midline tenderness, no fever to suggest epidural abscess.  Pt feels improved after meds in the ED.  Urine is not c/w UTI no hematuria to suggest ureteral stone.  Discharged with strict return precautions.  Pt agreeable with plan.  Final Clinical Impressions(s) / ED Diagnoses   Final diagnoses:  Acute right-sided low back pain without sciatica    ED Discharge Orders        Ordered    diazepam (VALIUM) 5 MG tablet  2 times daily     03/04/17 1230    oxyCODONE-acetaminophen (PERCOCET/ROXICET) 5-325 MG tablet  Every 6 hours PRN     03/04/17 1230    naproxen (NAPROSYN) 500 MG tablet  2 times daily     03/04/17 1230       Omkar Stratmann, Latanya Maudlin, MD 03/04/17 1301

## 2017-05-30 ENCOUNTER — Encounter (HOSPITAL_COMMUNITY): Payer: Self-pay | Admitting: Emergency Medicine

## 2017-05-30 ENCOUNTER — Emergency Department (HOSPITAL_COMMUNITY): Payer: 59

## 2017-05-30 ENCOUNTER — Emergency Department (HOSPITAL_COMMUNITY)
Admission: EM | Admit: 2017-05-30 | Discharge: 2017-05-30 | Disposition: A | Discharge status: Home / Self Care | Payer: 59 | Attending: Emergency Medicine | Admitting: Emergency Medicine

## 2017-05-30 ENCOUNTER — Other Ambulatory Visit: Payer: Self-pay

## 2017-05-30 DIAGNOSIS — R0600 Dyspnea, unspecified: Secondary | ICD-10-CM | POA: Insufficient documentation

## 2017-05-30 DIAGNOSIS — R42 Dizziness and giddiness: Secondary | ICD-10-CM | POA: Diagnosis present

## 2017-05-30 DIAGNOSIS — R110 Nausea: Secondary | ICD-10-CM | POA: Diagnosis not present

## 2017-05-30 DIAGNOSIS — I10 Essential (primary) hypertension: Secondary | ICD-10-CM | POA: Diagnosis not present

## 2017-05-30 LAB — CBC
HEMATOCRIT: 39.5 % (ref 36.0–46.0)
HEMOGLOBIN: 12.8 g/dL (ref 12.0–15.0)
MCH: 30.8 pg (ref 26.0–34.0)
MCHC: 32.4 g/dL (ref 30.0–36.0)
MCV: 95.2 fL (ref 78.0–100.0)
Platelets: 320 10*3/uL (ref 150–400)
RBC: 4.15 MIL/uL (ref 3.87–5.11)
RDW: 13.8 % (ref 11.5–15.5)
WBC: 5.4 10*3/uL (ref 4.0–10.5)

## 2017-05-30 LAB — BASIC METABOLIC PANEL
ANION GAP: 11 (ref 5–15)
BUN: 13 mg/dL (ref 6–20)
CALCIUM: 9.4 mg/dL (ref 8.9–10.3)
CO2: 30 mmol/L (ref 22–32)
Chloride: 102 mmol/L (ref 101–111)
Creatinine, Ser: 1.13 mg/dL — ABNORMAL HIGH (ref 0.44–1.00)
GFR calc non Af Amer: 55 mL/min — ABNORMAL LOW (ref >60)
Glucose, Bld: 123 mg/dL — ABNORMAL HIGH (ref 65–99)
POTASSIUM: 3.9 mmol/L (ref 3.5–5.1)
Sodium: 143 mmol/L (ref 135–145)

## 2017-05-30 LAB — I-STAT BETA HCG BLOOD, ED (MC, WL, AP ONLY)

## 2017-05-30 LAB — CBG MONITORING, ED: Glucose-Capillary: 128 mg/dL — ABNORMAL HIGH (ref 65–99)

## 2017-05-30 MED ORDER — ONDANSETRON HCL 4 MG/2ML IJ SOLN
4.0000 mg | Freq: Once | INTRAMUSCULAR | Status: AC
  Administered 2017-05-30: 4 mg via INTRAVENOUS
  Filled 2017-05-30: qty 2

## 2017-05-30 MED ORDER — DIAZEPAM 5 MG PO TABS
10.0000 mg | ORAL_TABLET | Freq: Once | ORAL | Status: AC
  Administered 2017-05-30: 10 mg via ORAL
  Filled 2017-05-30: qty 2

## 2017-05-30 MED ORDER — MECLIZINE HCL 25 MG PO TABS: 25.0000 mg | ORAL_TABLET | Freq: Three times a day (TID) | ORAL | 0 refills | Status: DC | PRN

## 2017-05-30 MED ORDER — MECLIZINE HCL 25 MG PO TABS
25.0000 mg | ORAL_TABLET | Freq: Once | ORAL | Status: AC
  Administered 2017-05-30: 25 mg via ORAL
  Filled 2017-05-30: qty 1

## 2017-05-30 MED ORDER — MECLIZINE HCL 25 MG PO TABS: 25.0000 mg | ORAL_TABLET | Freq: Once | ORAL | Status: DC

## 2017-05-30 MED ORDER — SODIUM CHLORIDE 0.9 % IV BOLUS (SEPSIS)
1000.0000 mL | Freq: Once | INTRAVENOUS | Status: AC
  Administered 2017-05-30: 1000 mL via INTRAVENOUS

## 2017-05-30 MED ORDER — ONDANSETRON 4 MG PO TBDP: 4.0000 mg | ORAL_TABLET | Freq: Once | ORAL | Status: DC

## 2017-05-30 MED ORDER — ONDANSETRON 4 MG PO TBDP: 4.0000 mg | ORAL_TABLET | Freq: Three times a day (TID) | ORAL | 0 refills | Status: DC | PRN

## 2017-05-30 NOTE — ED Notes (Signed)
Transported to MRI

## 2017-05-30 NOTE — ED Triage Notes (Signed)
[facility] of [facility]. Patient woke up to use the bathroom and stated sudden onset of dizziness "room spinning". BP 112/66 HR 65 O2 sat 100% CBG 99

## 2017-05-30 NOTE — Discharge Instructions (Signed)
These call and schedule a recheck appointment with your primary care provider in the next 3-4 days.  Take 1 tablet of meclizine every 8 hours as needed for dizziness.  You may let 1 tablet of Zofran dissolve under tongue every 8 hours for nausea or vomiting.  If you develop a headache you can take 650 mg of Tylenol every 6 hours or 600 mg of ibuprofen with food every 8 hours.   If you develop new or worsening symptoms, including worsening dizziness despite taking meclizine, vomiting despite taking Zofran, dizziness or lightheadedness with chest pain or shortness of breath, visual changes, or other new concerning symptoms, please return to the emergency department for re-evaluation.

## 2017-05-30 NOTE — ED Provider Notes (Signed)
Medical screening examination/treatment/procedure(s) were conducted as a shared visit with non-physician practitioner(s) and myself.  I personally evaluated the patient during the encounter.   EKG Interpretation None     Patient reports dizziness with a spinning quality.  It started in the evening.  She thought it might improve but by morning it was still there.  Much worse with position changes.  Patient is alert and appropriate.  She is very symptomatic with severe dizziness with position changes.  Positive Hallpike maneuver.  Patient is too symptomatic for gait testing.  I agree with plan of management.    Arby BarrettePfeiffer, Jlynn Langille, MD 05/31/17 [phone]

## 2017-05-30 NOTE — ED Provider Notes (Signed)
[facility]-EMERGENCY DEPT Provider Note   CSN: [id number] Arrival date & time: 05/30/17  0240     History   Chief Complaint Chief Complaint  Patient presents with  . Dizziness    HPI Leslie Rodriguez is a 52 y.o. female with a h/o of HTN who presents to the emergency department via EMS with a chief complaint of dizziness.     She reports dizziness, described as the room spinning around, with associated disequilibrium, blurred vision that began in the middle the night when she woke up to go to the restroom.  She reports that she laid down and attempted to go back to sleep, but when she woke up again the symptoms persisted so she called 911. Last known normal 9 PM when she went to bed.  Dizziness is worse with positional changes and moving.  No alleviating factors.  She reports dyspnea when she initially awoke, but it was related to panicking about her symptoms. She reports that mild otalgia to the right ear and has noted tinnitus, described as "feelling like I can hear the wind" to the right ear for the last few days after she removed ear wax 4 days ago.   She denies headache, fever, chills, vomiting, diarrhea, sore throat, URI symptoms, double vision, CP, abdominal pain, rash, weakness, numbness, lightheadedness, confusion, or neck pain or stiffness.   She has a history of HTN and takes metoprolol and HCTZ, which she has been compliant with.  She reports multiple family members in her household have been sick with viral-like illnesses with vomiting and diarrhea over the last few weeks.  The history is provided by the patient. No language interpreter was used.    Past Medical History:  Diagnosis Date  . Anxiety   . Depression   . Hypertension     Patient Active Problem List   Diagnosis Date Noted  . Frequency of urination 08/29/2016  . General weakness 08/29/2016  . Intermittent lightheadedness 08/29/2016  . Viral illness 08/29/2016  . Rectal bleeding  02/15/2016  . Colon cancer screening   . Diverticulosis of colon without hemorrhage   . Nausea with vomiting 09/14/2015  . Abdominal pain 09/14/2015  . GERD (gastroesophageal reflux disease) 09/14/2015  . Encounter for screening colonoscopy 09/14/2015  . Chest wall pain 04/10/2013  . Viral bronchitis 04/10/2013  . Depression, major, recurrent, moderate (HCC) 04/15/2012    Past Surgical History:  Procedure Laterality Date  . ABDOMINAL HYSTERECTOMY  2003  . CESAREAN SECTION  1985  . COLONOSCOPY  01/16/2006   RMR: normal rectum/left sided transverse diverticula  . COLONOSCOPY N/A 09/21/2015   Dr. Jena Gaussourk: internal hemorrhoids, diverticulosis     OB History    No data available       Home Medications    Prior to Admission medications   Medication Sig Start Date End Date Taking? Authorizing Provider  metoprolol succinate (TOPROL-XL) 100 MG 24 hr tablet Take 100 mg by mouth daily. 07/08/14  Yes [provider]  omeprazole (PRILOSEC) 20 MG capsule TAKE 1 CAPSULE BY MOUTH ONCE DAILY 12/04/16  Yes Anice PaganiniGill, Eric A, NP  triamterene-hydrochlorothiazide (MAXZIDE-25) 37.5-25 MG tablet Take 1 tablet by mouth daily. 05/07/17  Yes [provider]  meclizine (ANTIVERT) 25 MG tablet Take 1 tablet (25 mg total) by mouth 3 (three) times daily as needed for dizziness. 05/30/17   McDonald, Mia A, PA-C  ondansetron (ZOFRAN ODT) 4 MG disintegrating tablet Take 1 tablet (4 mg total) by mouth every 8 (  eight) hours as needed for nausea or vomiting. 05/30/17   McDonald, Mia A, PA-C    Family History Family History  Problem Relation Age of Onset  . Colon cancer Neg Hx     Social History Social History   Tobacco Use  . Smoking status: Never Smoker  . Smokeless tobacco: Never Used  Substance Use Topics  . Alcohol use: Yes    Comment: socially  . Drug use: No     Allergies   Ciprofloxacin hcl   Review of Systems Review of Systems  Constitutional: Negative for activity change,  chills and fever.  HENT: Positive for ear pain and tinnitus. Negative for congestion and sore throat.   Eyes: Positive for photophobia and visual disturbance.  Respiratory: Negative for shortness of breath.   Cardiovascular: Negative for chest pain.  Gastrointestinal: Positive for nausea. Negative for abdominal pain and vomiting.  Genitourinary: Negative for dysuria.  Musculoskeletal: Negative for back pain, myalgias, neck pain and neck stiffness.  Skin: Negative for rash.  Allergic/Immunologic: Negative for immunocompromised state.  Neurological: Positive for dizziness. Negative for syncope, speech difficulty, weakness, light-headedness, numbness and headaches.  Psychiatric/Behavioral: Negative for confusion.     Physical Exam Updated Vital Signs BP 118/70 (BP Location: Right Arm)   Pulse 72   Temp 97.6 F (36.4 C) (Oral)   Resp 18   Ht 5\' 3"  (1.6 m)   Wt 105.7 kg (233 lb)   SpO2 96%   BMI 41.27 kg/m   Physical Exam  Constitutional: She is oriented to person, place, and time. No distress.  HENT:  Head: Normocephalic.  Right Ear: A middle ear effusion is present.  Left Ear: A middle ear effusion is present.  Nose: Nose normal. Right sinus exhibits no maxillary sinus tenderness and no frontal sinus tenderness. Left sinus exhibits no maxillary sinus tenderness and no frontal sinus tenderness.  Mouth/Throat: Uvula is midline, oropharynx is clear and moist and mucous membranes are normal.  Eyes: Conjunctivae and EOM are normal. Pupils are equal, round, and reactive to light.  Neck: Normal range of motion. Neck supple.  Cardiovascular: Normal rate, regular rhythm, normal heart sounds and intact distal pulses. Exam reveals no gallop and no friction rub.  No murmur heard. Pulmonary/Chest: Effort normal. No stridor. No respiratory distress. She has no wheezes. She has no rales. She exhibits no tenderness.  Abdominal: Soft. Bowel sounds are normal. She exhibits no distension and no  mass. There is no tenderness. There is no rebound and no guarding. No hernia.  Neurological: She is alert and oriented to person, place, and time.  Dysmetria bilaterally with finger-to-nose. Decreased sensation to light touch over the V2 distribution of the right trigeminal nerve.   Otherwise, cranial nerves II through XII appear intact. 5/5 strength of the bilateral upper and lower extremities.  Ambulation and Romberg test are deferred at this time secondary to the patient's symptoms.  Skin: Skin is warm. Capillary refill takes less than 2 seconds. No rash noted. She is not diaphoretic.  Psychiatric: Her behavior is normal.  Nursing note and vitals reviewed.    ED Treatments / Results  Labs (all labs ordered are listed, but only abnormal results are displayed) Labs Reviewed  BASIC METABOLIC PANEL - Abnormal; Notable for the following components:      Result Value   Glucose, Bld 123 (*)    Creatinine, Ser 1.13 (*)    GFR calc non Af Amer 55 (*)    All other components within normal limits  CBG MONITORING, ED - Abnormal; Notable for the following components:   Glucose-Capillary 128 (*)    All other components within normal limits  CBC  I-STAT BETA HCG BLOOD, ED ([facility], [facility], [facility] ONLY)    EKG  EKG Interpretation None       Radiology Mr Brain Wo Contrast  Result Date: 05/30/2017 CLINICAL DATA:  Stat ER, complains of dizziness.  Possible stroke. EXAM: MRI HEAD WITHOUT CONTRAST TECHNIQUE: Multiplanar, multiecho pulse sequences of the brain and surrounding structures were obtained without intravenous contrast. COMPARISON:  CT head 12/07/2006. FINDINGS: Brain: No evidence for acute infarction, hemorrhage, mass lesion, hydrocephalus, or extra-axial fluid. Normal cerebral volume. Mild subcortical and periventricular T2 and FLAIR hyperintensities, likely chronic microvascular ischemic change. Vascular: Flow voids are maintained throughout the carotid, basilar, and vertebral arteries. There are  no areas of chronic hemorrhage. Skull and upper cervical spine: Normal marrow signal. Empty sella with expansion. Upper cervical region unremarkable. Sinuses/Orbits: Negative. Other: None. IMPRESSION: Empty sella with expansion, unchanged from 2008, likely incidental finding. No acute stroke or other significant intracranial abnormality. Mild T2 and FLAIR hyperintensities of the white matter, likely chronic microvascular ischemic change. Electronically Signed   By: Elsie Stain M.D.   On: 05/30/2017 09:43    Procedures Procedures (including critical care time)  Medications Ordered in ED Medications  ondansetron (ZOFRAN) injection 4 mg (4 mg Intravenous Given 05/30/17 0803)  diazepam (VALIUM) tablet 10 mg (10 mg Oral Given 05/30/17 0804)  meclizine (ANTIVERT) tablet 25 mg (25 mg Oral Given 05/30/17 1106)  sodium chloride 0.9 % bolus 1,000 mL (0 mLs Intravenous Stopped 05/30/17 1546)     Initial Impression / Assessment and Plan / ED Course  I have reviewed the triage vital signs and the nursing notes.  Pertinent labs & imaging results that were available during my care of the patient were reviewed by me and considered in my medical decision making (see chart for details).  Clinical Course as of May 31 1707  Thu May 30, 2017  8119 Patient noted to have dysmetria bilaterally with finger to nose exam. The patient was further evaluated by myself and Dr. Donnald Garre.   [MM]    Clinical Course User Index [MM] McDonald, Mia A, PA-C    52 year old female with a history of hypertension presenting to the ED with dizziness, disequilibrium, nausea, blurred vision, tinnitus, and right otalgia.  On physical exam, the patient has dysmetria with finger to nose bilaterally.  The patient was also evaluated by Dr. Clarice Pole, attending physician.  Valium and Zofran given in the ED.  MRI brain was ordered given concern for central vs peripheral vertigo being the source of her symptoms.  MRI brain negative for acute  CVA.  On reevaluation, patient's finger to nose was normal after Valium she reported her blurred vision resolved.  Attempted to stand the patient to check her gait, but she still appeared off balance.  UA with high specific gravity and mild ketonuria.  Ordered meclizine and give the patient an IV fluid bolus.  On reevaluation, the patient was ambulatory without difficulty and was asymptomatic.  At this time, I feel the patient is safe for discharge.  She is hemodynamically stable in no acute distress.  We will discharge the patient with Zofran and meclizine and follow-up to her PCP within the next 4 days.  Final Clinical Impressions(s) / ED Diagnoses   Final diagnoses:  Vertigo    ED Discharge Orders        Ordered  meclizine (ANTIVERT) 25 MG tablet  3 times daily PRN     05/30/17 1455    ondansetron (ZOFRAN ODT) 4 MG disintegrating tablet  Every 8 hours PRN     05/30/17 1458       McDonald, Mia A, PA-C 05/30/17 1709    Arby Barrette, MD 05/31/17 1751

## 2017-05-30 NOTE — ED Notes (Signed)
Pt taken to MRI  

## 2017-05-30 NOTE — ED Notes (Signed)
[facility] Expected date:  Expected time:  Means of arrival:  Comments: EMS dizziness

## 2017-05-30 NOTE — ED Notes (Signed)
Pt's O2 was 98% on RA while ambulating in the hallway

## 2017-06-10 ENCOUNTER — Other Ambulatory Visit: Payer: Self-pay | Admitting: Nurse Practitioner

## 2018-02-20 ENCOUNTER — Other Ambulatory Visit: Payer: Self-pay | Admitting: Family Medicine

## 2018-02-20 DIAGNOSIS — Z7952 Long term (current) use of systemic steroids: Secondary | ICD-10-CM

## 2018-03-18 ENCOUNTER — Other Ambulatory Visit: Payer: Self-pay | Admitting: Family Medicine

## 2018-03-18 DIAGNOSIS — E8941 Symptomatic postprocedural ovarian failure: Secondary | ICD-10-CM

## 2018-03-18 DIAGNOSIS — Z7952 Long term (current) use of systemic steroids: Secondary | ICD-10-CM

## 2018-03-19 ENCOUNTER — Ambulatory Visit
Admission: RE | Admit: 2018-03-19 | Discharge: 2018-03-19 | Disposition: A | Discharge status: Home / Self Care | Payer: 59 | Source: Ambulatory Visit | Attending: Family Medicine | Admitting: Family Medicine

## 2018-03-19 DIAGNOSIS — E8941 Symptomatic postprocedural ovarian failure: Secondary | ICD-10-CM

## 2018-03-19 DIAGNOSIS — Z7952 Long term (current) use of systemic steroids: Secondary | ICD-10-CM

## 2019-01-30 IMAGING — MR MR HEAD W/O CM
8 of 10 series · 36 of 48 positions shown · non-contrast
Comparison: CT head 12/07/2006.

CLINICAL DATA: Stat ER, complains of dizziness.  Possible stroke.

EXAM:
MRI HEAD WITHOUT CONTRAST
TECHNIQUE: Multiplanar, multiecho pulse sequences of the brain and surrounding
structures were obtained without intravenous contrast.

[Series 3: DWI · axial · 3.0mm · 1.09mm/px · z∈[-25,+113]mm · 8 of 94 slices shown (1 of 4)]
[im 1/94]
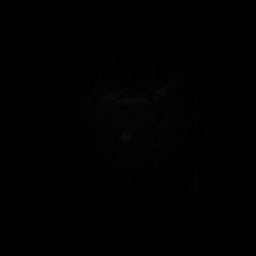
[im 11/94]
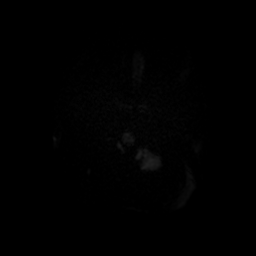
[im 32/94]
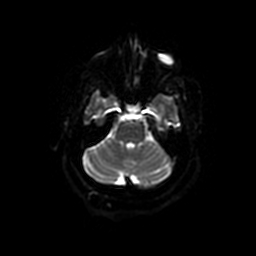
[im 42/94]
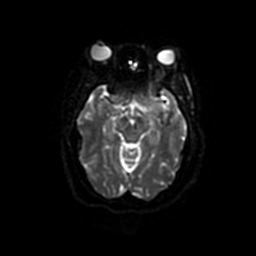
[im 52/94]
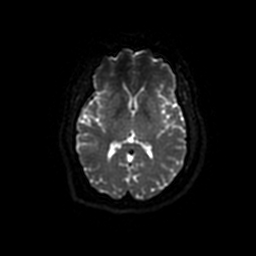
[im 63/94]
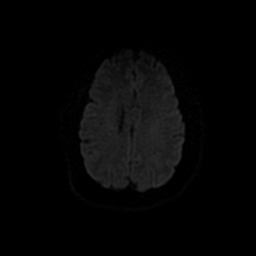
[im 83/94]
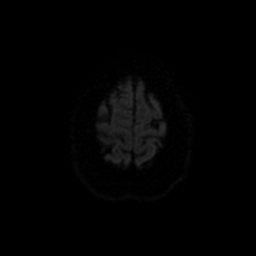
[im 94/94]
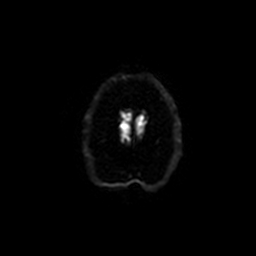

[Series 4: T1 · sagittal · 5.0mm · 0.47mm/px · 3 of 24 slices shown]
[im 1/24]
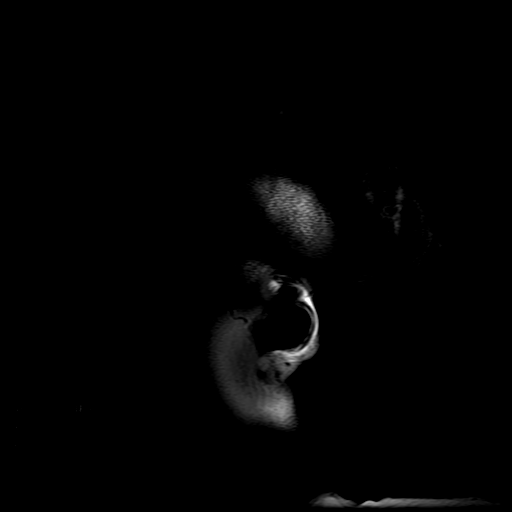
[im 12/24]
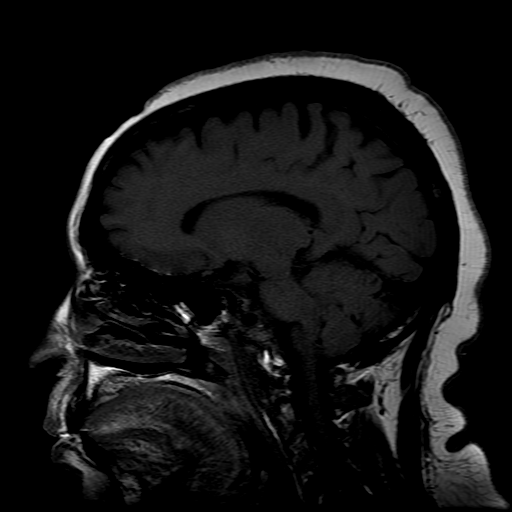
[im 24/24]
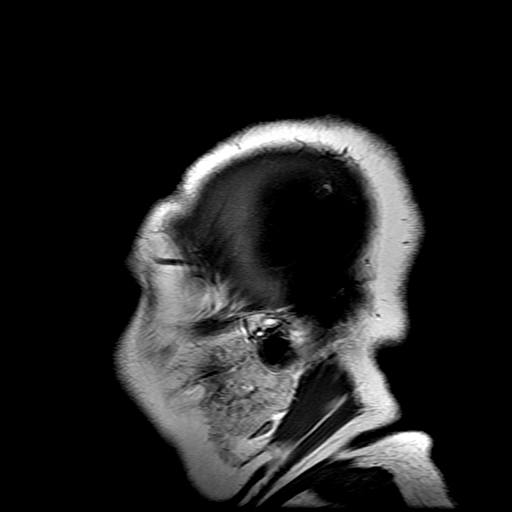

[Series 5: DWI · coronal · 5.0mm · 1.09mm/px · 7 of 64 slices shown (2 of 4)]
[im 1/64]
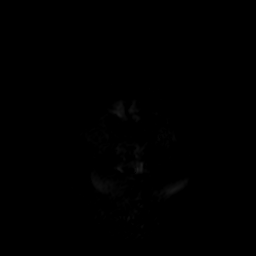
[im 11/64]
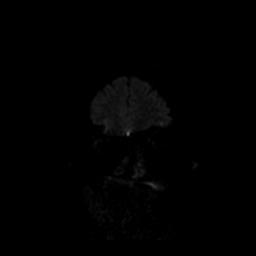
[im 22/64]
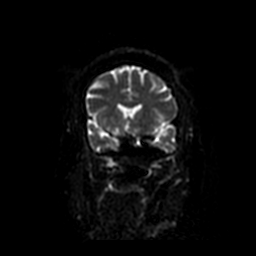
[im 32/64]
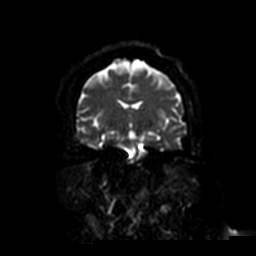
[im 43/64]
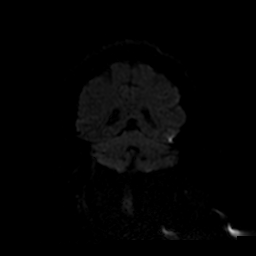
[im 53/64]
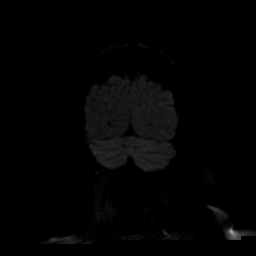
[im 64/64]
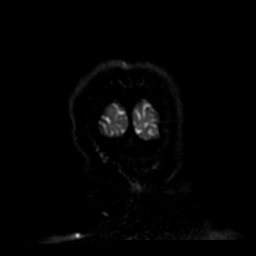

[Series 6: T2 · axial · 5.0mm · 0.43mm/px · z∈[-46,+115]mm · 3 of 28 slices shown (1 of 2)]
[im 1/28]
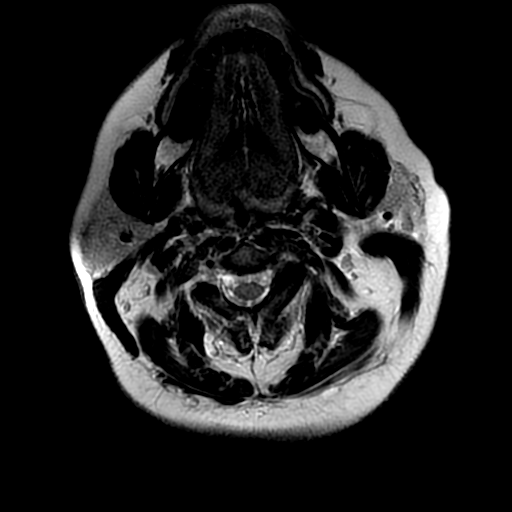
[im 14/28]
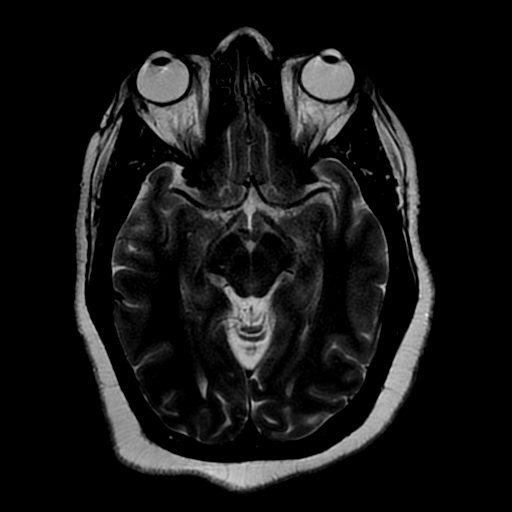
[im 28/28]
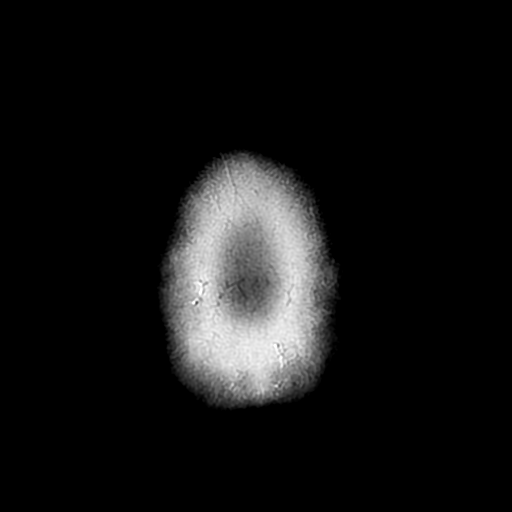

[Series 7: FLAIR · axial · 5.0mm · 0.43mm/px · z∈[-46,+115]mm · 3 of 28 slices shown]
[im 1/28]
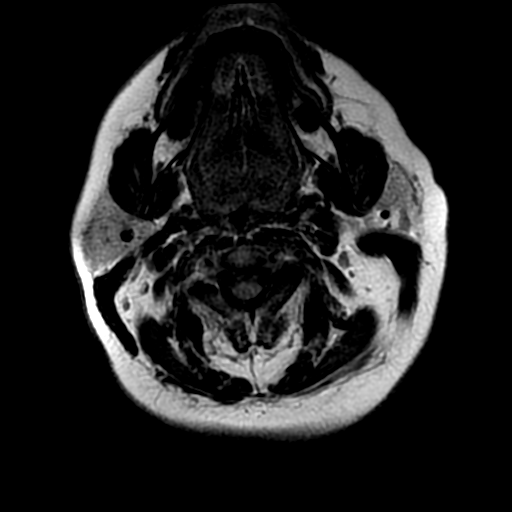
[im 14/28]
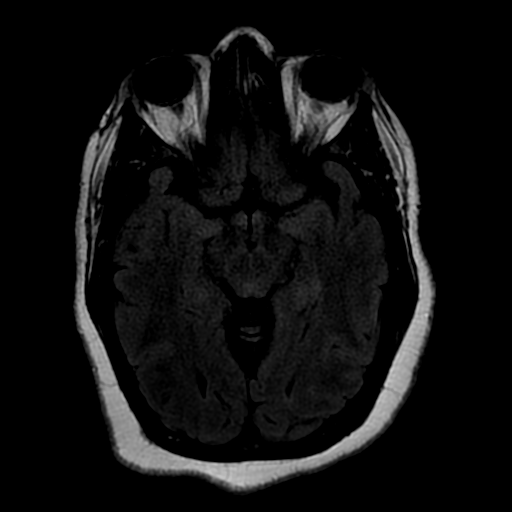
[im 28/28]
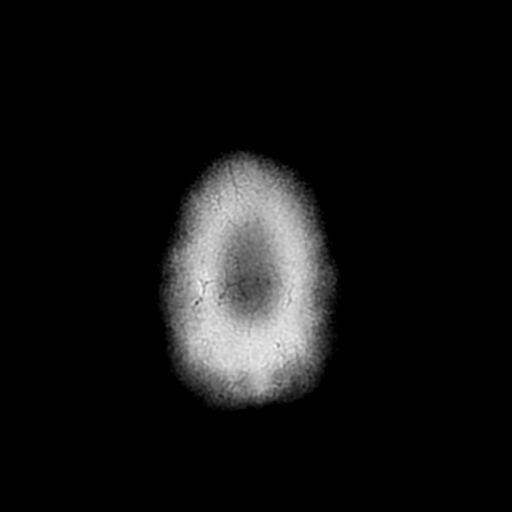

[Series 10: T2 · coronal · 5.0mm · 0.47mm/px · 3 of 24 slices shown (2 of 2)]
[im 1/24]
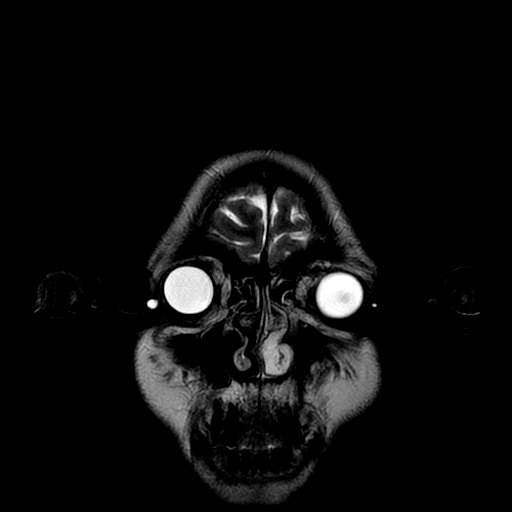
[im 12/24]
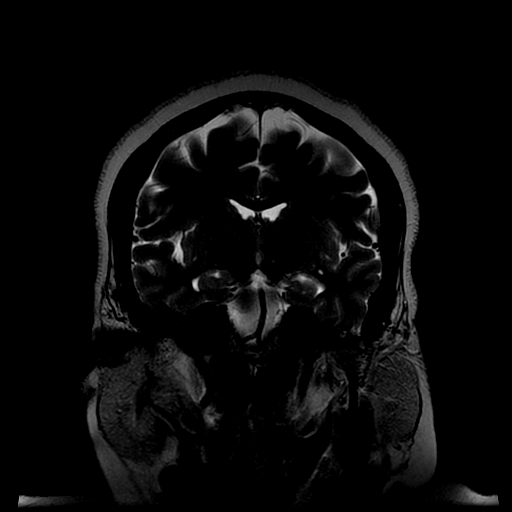
[im 24/24]
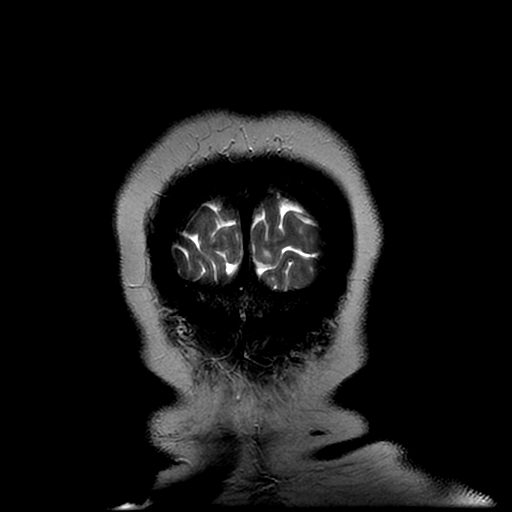

[Series 300: DWI · axial · 3.0mm · 1.09mm/px · z∈[-25,+113]mm · 5 of 47 slices shown (3 of 4)]
[im 1/47]
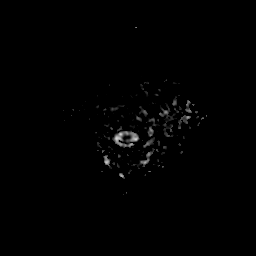
[im 12/47]
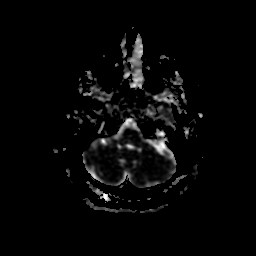
[im 24/47]
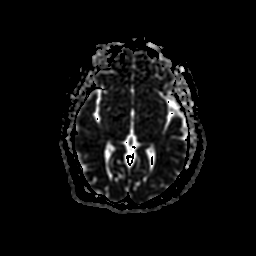
[im 35/47]
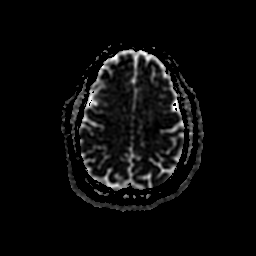
[im 47/47]
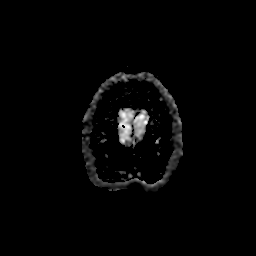

[Series 500: DWI · coronal · 5.0mm · 1.09mm/px · 4 of 32 slices shown (4 of 4)]
[im 1/32]
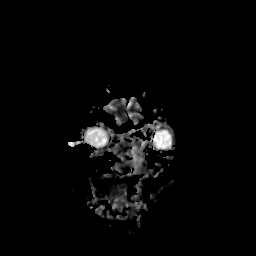
[im 11/32]
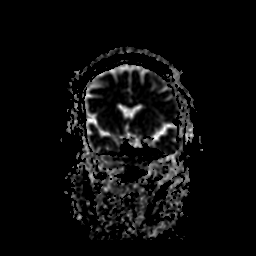
[im 21/32]
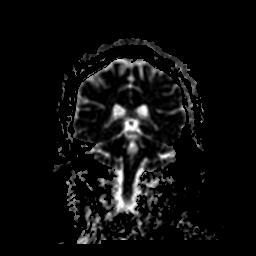
[im 32/32]
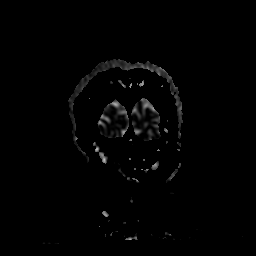

[36 of 48 positions shown; findings below may reference images not displayed]

FINDINGS: Brain: No evidence for acute infarction, hemorrhage, mass lesion,
hydrocephalus, or extra-axial fluid. Normal cerebral volume. Mild
subcortical and periventricular T2 and FLAIR hyperintensities,
likely chronic microvascular ischemic change.

Vascular: Flow voids are maintained throughout the carotid, basilar,
and vertebral arteries. There are no areas of chronic hemorrhage.

Skull and upper cervical spine: Normal marrow signal. Empty sella
with expansion. Upper cervical region unremarkable.

Sinuses/Orbits: Negative.

Other: None.
IMPRESSION: Empty sella with expansion, unchanged from 0888, likely incidental
finding.

No acute stroke or other significant intracranial abnormality.

Mild T2 and FLAIR hyperintensities of the white matter, likely
chronic microvascular ischemic change.

## 2019-10-21 NOTE — Progress Notes (Signed)
Referring Provider: Leilani Able, MD Primary Care Physician:  Leilani Able, MD Primary GI: Dr. Jena Gauss   Chief Complaint  Patient presents with  . Blood In Stools    not as much this week  . Nausea  . Abdominal Pain    upper abd, getting some better now    HPI:   Leslie Rodriguez is a 54 y.o. female presenting today with a history of GERD, constipation,  internal hemorrhoids s/p banding of left lateral, right posterior, right anterior in 2018. Last colonoscopy 2017. Patient requested visit.  Last week was in significant abdominal pain across mid abdomen. Had large amount of rectal bleeding with BMs. Nothing would ease the pain. Was vomiting trying to eat. Felt nauseated. Felt like she had to poop but couldn't, then had bleeding. Had 2 episodes of bleeding, then a small amount on tissue for 24 hours. Abdominal pain persisted for several days. No fever. Tried tylenol but without improvement then took [facility] powders. Doesn't normally take BC powders. Didn't eat much this past weekend. Tuesday had soup, yesterday PB crackers and stayed down. Started feeling better Tuesday. No pain currently. Just still feels crampy. Nausea better now. No NSAIDs prior to this. No diarrhea. BM usually daily to every other day. Will drink green juice from store to help go to bathroom.   Starts coughing after eating. Feels like pepper in her throat. Prilosec 20 mg daily. Happens with first few bites. Will have coughing spell and then gone.   Has had some lower extremity edema and PCP has referred to cardiologist. Will be seeing Vein Specialist.   Past Medical History:  Diagnosis Date  . Anxiety   . Depression   . Hypertension     Past Surgical History:  Procedure Laterality Date  . ABDOMINAL HYSTERECTOMY  2003  . CESAREAN SECTION  1985  . COLONOSCOPY  01/16/2006   RMR: normal rectum/left sided transverse diverticula  . COLONOSCOPY N/A 09/21/2015   Dr. Jena Gauss: internal hemorrhoids, diverticulosis      Current Outpatient Medications  Medication Sig Dispense Refill  . furosemide (LASIX) 40 MG tablet Take 40 mg by mouth 2 (two) times daily.    . metoprolol succinate (TOPROL-XL) 100 MG 24 hr tablet Take 100 mg by mouth daily.    Marland Kitchen omeprazole (PRILOSEC) 20 MG capsule TAKE 1 CAPSULE BY MOUTH ONCE DAILY 30 capsule 3  . meclizine (ANTIVERT) 25 MG tablet Take 1 tablet (25 mg total) by mouth 3 (three) times daily as needed for dizziness. (Patient not taking: Reported on 10/22/2019) 30 tablet 0  . ondansetron (ZOFRAN ODT) 4 MG disintegrating tablet Take 1 tablet (4 mg total) by mouth every 8 (eight) hours as needed for nausea or vomiting. (Patient not taking: Reported on 10/22/2019) 20 tablet 0  . triamterene-hydrochlorothiazide (MAXZIDE-25) 37.5-25 MG tablet Take 1 tablet by mouth daily. (Patient not taking: Reported on 10/22/2019)     No current facility-administered medications for this visit.    Allergies as of 10/22/2019 - Review Complete 10/22/2019  Allergen Reaction Noted  . Ciprofloxacin hcl Hives and Shortness Of Breath 07/27/2011    Family History  Problem Relation Age of Onset  . Colon cancer Neg Hx     Social History   Socioeconomic History  . Marital status: Single    Spouse name: Not on file  . Number of children: Not on file  . Years of education: Not on file  . Highest education level: Not on file  Occupational History  . Not on file  Tobacco Use  . Smoking status: Never Smoker  . Smokeless tobacco: Never Used  Substance and Sexual Activity  . Alcohol use: Yes    Comment: socially  . Drug use: No  . Sexual activity: Not Currently  Other Topics Concern  . Not on file  Social History Narrative  . Not on file   Social Determinants of Health   Financial Resource Strain:   . Difficulty of Paying Living Expenses:   Food Insecurity:   . Worried About Programme researcher, broadcasting/film/video in the Last Year:   . Barista in the Last Year:   Transportation Needs:   . Automotive engineer (Medical):   Marland Kitchen Lack of Transportation (Non-Medical):   Physical Activity:   . Days of Exercise per Week:   . Minutes of Exercise per Session:   Stress:   . Feeling of Stress :   Social Connections:   . Frequency of Communication with Friends and Family:   . Frequency of Social Gatherings with Friends and Family:   . Attends Religious Services:   . Active Member of Clubs or Organizations:   . Attends Banker Meetings:   Marland Kitchen Marital Status:     Review of Systems: Gen: Denies fever, chills, anorexia. Denies fatigue, weakness, weight loss.  CV: Denies chest pain, palpitations, syncope, peripheral edema, and claudication. Resp: Denies dyspnea at rest, cough, wheezing, coughing up blood, and pleurisy. GI: see HPI Derm: Denies rash, itching, dry skin Psych: Denies depression, anxiety, memory loss, confusion. No homicidal or suicidal ideation.  Heme: see HPI  Physical Exam: BP 132/82   Pulse 68   Temp (!) 97.2 F (36.2 C)   Ht 5\' 3"  (1.6 m)   Wt 266 lb 9.6 oz (120.9 kg)   BMI 47.23 kg/m  General:   Alert and oriented. No distress noted. Pleasant and cooperative.  Head:  Normocephalic and atraumatic. Eyes:  Conjuctiva clear without scleral icterus. Mouth:  Mask in place Cardiac: S1 S2 present, distant, unable to hear definite murmur Lungs: clear bilaterally Abdomen:  +BS, soft, mild "sore" to palpation lower abdomen and non-distended. No rebound or guarding. No HSM or masses noted. Msk:  Symmetrical without gross deformities. Normal posture. Extremities:  Without edema. Neurologic:  Alert and  oriented x4 Psych:  Alert and cooperative. Normal mood and affect.  ASSESSMENT: Leslie Rodriguez is a 54 y.o. female presenting today with recent history of acute abdominal pain and rectal bleeding, concerning for likely ischemic colitis. Clinically, she has improved and without pain today, no further rectal bleeding. Doubt diverticular as would not routinely have  rectal bleeding with diverticulitis. As she has improved, will follow clinically for now. Last colonoscopy 2017 with internal hemorrhoids and diverticulosis.  I have asked her to call if any recurrent pain. CBC today.    PLAN:  CBC today  CT if no improvement  Return in 3-4 months   2018, PhD, [facility]

## 2019-10-22 ENCOUNTER — Encounter: Payer: Self-pay | Admitting: Gastroenterology

## 2019-10-22 ENCOUNTER — Other Ambulatory Visit: Payer: Self-pay

## 2019-10-22 ENCOUNTER — Ambulatory Visit: Payer: 59 | Admitting: Gastroenterology

## 2019-10-22 VITALS — BP 132/82 | HR 68 | Temp 97.2°F | Ht 63.0 in | Wt 266.6 lb

## 2019-10-22 DIAGNOSIS — K625 Hemorrhage of anus and rectum: Secondary | ICD-10-CM

## 2019-10-22 DIAGNOSIS — R109 Unspecified abdominal pain: Secondary | ICD-10-CM

## 2019-10-22 LAB — CBC WITH DIFFERENTIAL/PLATELET
Absolute Monocytes: 452 cells/uL (ref 200–950)
Basophils Absolute: 52 cells/uL (ref 0–200)
Basophils Relative: 0.9 %
Eosinophils Absolute: 122 cells/uL (ref 15–500)
Eosinophils Relative: 2.1 %
HCT: 38.1 % (ref 35.0–45.0)
Hemoglobin: 12.7 g/dL (ref 11.7–15.5)
Lymphs Abs: 2691 cells/uL (ref 850–3900)
MCH: 31 pg (ref 27.0–33.0)
MCHC: 33.3 g/dL (ref 32.0–36.0)
MCV: 92.9 fL (ref 80.0–100.0)
MPV: 10.2 fL (ref 7.5–12.5)
Monocytes Relative: 7.8 %
Neutro Abs: 2482 cells/uL (ref 1500–7800)
Neutrophils Relative %: 42.8 %
Platelets: 340 10*3/uL (ref 140–400)
RBC: 4.1 10*6/uL (ref 3.80–5.10)
RDW: 13.6 % (ref 11.0–15.0)
Total Lymphocyte: 46.4 %
WBC: 5.8 10*3/uL (ref 3.8–10.8)

## 2019-10-22 NOTE — Patient Instructions (Signed)
Please call me if pain persists or worsens, and we will do a CT scan.  Please have blood work done today.  I will see you in 3-4 months!  I enjoyed seeing you again today! As you know, I value our relationship and want to provide genuine, compassionate, and quality care. I welcome your feedback. If you receive a survey regarding your visit,  I greatly appreciate you taking time to fill this out. See you next time!  Gelene Mink, PhD, ANP-BC [facility] Gastroenterology

## 2019-11-03 DIAGNOSIS — R600 Localized edema: Secondary | ICD-10-CM | POA: Insufficient documentation

## 2019-11-03 NOTE — Progress Notes (Deleted)
    Patient referred by Lin Landsman, MD for leg edema  Subjective:   Leslie Rodriguez, female    DOB: [birth year], 54 y.o.   MRN: [id number]  *** No chief complaint on file.   *** HPI  54 y.o. African American  female with hypertension, anxiety, depression, leg edema  *** Past Medical History:  Diagnosis Date  . Anxiety   . Depression   . Hypertension     *** Past Surgical History:  Procedure Laterality Date  . ABDOMINAL HYSTERECTOMY  2003  . CESAREAN SECTION  1985  . COLONOSCOPY  01/16/2006   RMR: normal rectum/left sided transverse diverticula  . COLONOSCOPY N/A 09/21/2015   Dr. Gala Romney: internal hemorrhoids, diverticulosis     *** Social History   Tobacco Use  Smoking Status Never Smoker  Smokeless Tobacco Never Used    Social History   Substance and Sexual Activity  Alcohol Use Yes   Comment: socially    *** Family History  Problem Relation Age of Onset  . Colon cancer Neg Hx     *** Current Outpatient Medications on File Prior to Visit  Medication Sig Dispense Refill  . furosemide (LASIX) 40 MG tablet Take 40 mg by mouth 2 (two) times daily.    . metoprolol succinate (TOPROL-XL) 100 MG 24 hr tablet Take 100 mg by mouth daily.    Marland Kitchen omeprazole (PRILOSEC) 20 MG capsule TAKE 1 CAPSULE BY MOUTH ONCE DAILY 30 capsule 3   No current facility-administered medications on file prior to visit.    Cardiovascular and other pertinent studies:  *** EKG ***/***/202***: ***  *** Recent labs: 10/23/2019: H/H 12.7/38.1. MCV 92. Platelets 340  02/19/2018: Glucose 94, BUN/Cr 15/1.13. EGFR 65. Na/K 142/4.6.    *** ROS      *** There were no vitals filed for this visit.  *** There is no height or weight on file to calculate BMI. There were no vitals filed for this visit.  *** Objective:   Physical Exam    ***     Assessment & Recommendations:   54 y.o. African American  female with hypertension, anxiety, depression, leg  edema  ***     Thank you for referring the patient to [place]. Please feel free to contact with any questions.  Nigel Mormon, MD [facility]. [facility] Pager: [phone] Office: [phone]

## 2019-11-04 ENCOUNTER — Ambulatory Visit: Payer: Self-pay | Admitting: Cardiology

## 2019-11-25 ENCOUNTER — Encounter: Payer: Self-pay | Admitting: Cardiology

## 2019-11-25 ENCOUNTER — Other Ambulatory Visit: Payer: Self-pay

## 2019-11-25 ENCOUNTER — Ambulatory Visit: Payer: 59 | Admitting: Cardiology

## 2019-11-25 VITALS — BP 150/82 | HR 67 | Resp 16 | Ht 63.0 in | Wt 267.0 lb

## 2019-11-25 DIAGNOSIS — I10 Essential (primary) hypertension: Secondary | ICD-10-CM

## 2019-11-25 DIAGNOSIS — M7989 Other specified soft tissue disorders: Secondary | ICD-10-CM

## 2019-11-25 MED ORDER — CHLORTHALIDONE 25 MG PO TABS: 12.5000 mg | ORAL_TABLET | Freq: Every morning | ORAL | 0 refills | Status: DC

## 2019-11-25 NOTE — Progress Notes (Signed)
Date:  11/25/2019   ID:  Christoper Allegra, DOB 05-30-1965, MRN [id number]  PCP:  Lin Landsman, MD  Cardiologist:  Rex Kras, DO, [facility] (established care 11/25/2019)  REASON FOR CONSULT: Hypertension and Leg swelling.   REQUESTING PHYSICIAN:  Lin Landsman, [address] [place],  [place] [address]  Chief Complaint  Patient presents with  . Hypertension  . Chronic Pedal Edema  . New Patient (Initial Visit)    Referred by Dr. Lin Landsman    HPI  SPECIAL RANES is a 54 y.o. female who presents to the office with a chief complaint of " hypertension and lower extremity swelling." She is referred to the office at the request of Lin Landsman, MD. Patient's past medical history and cardiovascular risk factors include: Benign essential hypertension, obesity due to excess calories.  Patient is referred to the office at the request of her primary care provider for evaluation of hypertension and lower extremity swelling.  Patient states that she has been having high blood pressure since in her mid 40s she has been on medical therapy.  She does not check her blood pressures at home.  She tries to consume a low-salt diet.  Her initial blood pressure readings in the office were elevated but on repeat check her blood pressure was 150/82.  Lower extremity swelling: Patient states that the swelling has been going on for at least 1 month in duration.  It is more towards the mid afternoon that she notices the swelling being up to the midshin level.  The swelling improves with elevation of legs and in the morning.  Her diuretics have been uptitrated by her primary care provider.  Her hydrochlorothiazide/triamterene was discontinued and is currently on Lasix 40 mg p.o. twice daily.  She is here for further evaluation.  She denies any recent immobilization for greater than 6 hours, no recent surgery, no prior history of DVT or PE.  No known history of chronic venous insufficiency.  She has not had any  diagnostic work-up in the past.  Denies prior history of coronary artery disease, myocardial infarction, congestive heart failure, deep venous thrombosis, pulmonary embolism, stroke, transient ischemic attack.  FUNCTIONAL STATUS: No structured exercise program or daily routine.   ALLERGIES: Allergies  Allergen Reactions  . Ciprofloxacin Hcl Hives and Shortness Of Breath    MEDICATION LIST PRIOR TO VISIT: Current Meds  Medication Sig  . metoprolol succinate (TOPROL-XL) 100 MG 24 hr tablet Take 100 mg by mouth daily.  Marland Kitchen omeprazole (PRILOSEC) 20 MG capsule TAKE 1 CAPSULE BY MOUTH ONCE DAILY  . [DISCONTINUED] furosemide (LASIX) 40 MG tablet Take 40 mg by mouth 2 (two) times daily.     PAST MEDICAL HISTORY: Past Medical History:  Diagnosis Date  . Anxiety   . Depression   . Hypertension   . Obesity     PAST SURGICAL HISTORY: Past Surgical History:  Procedure Laterality Date  . ABDOMINAL HYSTERECTOMY  2003  . CESAREAN SECTION  1985  . COLONOSCOPY  01/16/2006   RMR: normal rectum/left sided transverse diverticula  . COLONOSCOPY N/A 09/21/2015   Dr. Gala Romney: internal hemorrhoids, diverticulosis     FAMILY HISTORY: The patient family history includes Edema in her mother.  SOCIAL HISTORY:  The patient  reports that she has never smoked. She has never used smokeless tobacco. She reports current alcohol use. She reports that she does not use drugs.  REVIEW OF SYSTEMS: Review of Systems  Constitutional: Negative for chills and fever.  HENT: Negative for hoarse voice and nosebleeds.   Eyes: Negative for discharge, double vision and pain.  Cardiovascular: Positive for leg swelling. Negative for chest pain, claudication, dyspnea on exertion, near-syncope, orthopnea, palpitations, paroxysmal nocturnal dyspnea and syncope.  Respiratory: Negative for hemoptysis and shortness of breath.   Musculoskeletal: Negative for muscle cramps and myalgias.  Gastrointestinal: Negative for  abdominal pain, constipation, diarrhea, hematemesis, hematochezia, melena, nausea and vomiting.  Neurological: Negative for dizziness and light-headedness.   PHYSICAL EXAM: Vitals with BMI 11/25/2019 10/22/2019 05/30/2017  Height '5\' 3"'$  '5\' 3"'$  -  Weight 267 lbs 266 lbs 10 oz -  BMI 74.12 87.86 -  Systolic 767 209 470  Diastolic 82 82 70  Pulse 67 68 72    CONSTITUTIONAL: Well-developed and well-nourished. No acute distress.  SKIN: Skin is warm and dry. No rash noted. No cyanosis. No pallor. No jaundice HEAD: Normocephalic and atraumatic.  EYES: No scleral icterus MOUTH/THROAT: Moist oral membranes.  NECK: No JVD present. No thyromegaly noted. No carotid bruits  LYMPHATIC: No visible cervical adenopathy.  CHEST Normal respiratory effort. No intercostal retractions  LUNGS: Clear to auscultation bilaterally.  No stridor. No wheezes. No rales.  CARDIOVASCULAR: Regular, positive S1-S2, no murmurs rubs or gallops appreciated. ABDOMINAL: Obese, soft, nontender, nondistended, positive bowel sounds in all 4 quadrants.  No apparent ascites.  EXTREMITIES: Pedal edema noted bilaterally.  Trace bilateral edema in the lower extremity.  2+ dorsalis pedis and posterior tibial pulses. HEMATOLOGIC: No significant bruising NEUROLOGIC: Oriented to person, place, and time. Nonfocal. Normal muscle tone.  PSYCHIATRIC: Normal mood and affect. Normal behavior. Cooperative  CARDIAC DATABASE: EKG:  11/25/2019: Normal sinus rhythm, 68 bpm, normal axis, incomplete right bundle branch block, nonspecific T wave abnormalities in the anteroseptal leads.  Echocardiogram: None  Stress Testing: None  Heart Catheterization: None  LABORATORY DATA:  External Labs: Collected: 02/19/2018 Creatinine 1.13 mg/dL. eGFR: 65 mL/min per 1.73 m  IMPRESSION:    ICD-10-CM   1. Essential hypertension  I10 EKG 12-Lead    PCV ECHOCARDIOGRAM COMPLETE    chlorthalidone (HYGROTON) 25 MG tablet    Basic metabolic panel     Magnesium    Magnesium    Basic metabolic panel    CANCELED: Basic metabolic panel    CANCELED: Magnesium  2. Leg swelling  M79.89 PCV LOWER VENOUS US (BILATERAL)    Basic metabolic panel    Magnesium  3. Class 3 severe obesity due to excess calories without serious comorbidity with body mass index (BMI) of 45.0 to 49.9 in adult [facility])  E66.01    Z68.42      RECOMMENDATIONS: TELICIA HODGKISS is a 54 y.o. female whose past medical history and cardiac risk factors include: Hypertension and obesity.  Benign essential hypertension . Office blood pressure is not at goal.  . Medication reconciled.  . She is asked to keep a log of both blood pressure and pulse so that medications can be titrated based on a trend as opposed to isolated blood pressure readings in the office. . If the blood pressure is consistently greater than 125mHg patient is asked to call the office or her primary care provider's office for medication titration sooner than the next office visit.  . Low salt diet recommended. A diet that is rich in fruits, vegetables, legumes, and low-fat dairy products and low in snacks, sweets, and meats (such as the Dietary Approaches to Stop Hypertension [DASH] diet).  . Recommend discontinuation of Lasix. . Check baseline BMP and magnesium  level. . Start chlorthalidone 12.5 mg p.o. every morning. . Repeat blood work in 1 week to evaluate kidney function and electrolytes. . Patient states that she is going to [facility] and will make the medication changes as of December 01, 2019. Marland Kitchen Echocardiogram will be ordered to evaluate for structural heart disease and left ventricular systolic function.  Lower extremity swelling:  Plan lower extremity duplex to evaluate for chronic venous insufficiency and DVT.  Change Lasix to chlorthalidone.  Recommended compression stockings and raising her legs when possible.  Obesity, due to excess calories: Body mass index is 47.3 kg/m. . I reviewed  with the patient the importance of diet, regular physical activity/exercise, weight loss.   . Patient is educated on increasing physical activity gradually as tolerated.  With the goal of moderate intensity exercise for 30 minutes a day 5 days a week.  FINAL MEDICATION LIST END OF ENCOUNTER: Meds ordered this encounter  Medications  . chlorthalidone (HYGROTON) 25 MG tablet    Sig: Take 0.5 tablets (12.5 mg total) by mouth in the morning.    Dispense:  15 tablet    Refill:  0    Medications Discontinued During This Encounter  Medication Reason  . furosemide (LASIX) 40 MG tablet      Current Outpatient Medications:  .  metoprolol succinate (TOPROL-XL) 100 MG 24 hr tablet, Take 100 mg by mouth daily., Disp: , Rfl:  .  omeprazole (PRILOSEC) 20 MG capsule, TAKE 1 CAPSULE BY MOUTH ONCE DAILY, Disp: 30 capsule, Rfl: 3 .  chlorthalidone (HYGROTON) 25 MG tablet, Take 0.5 tablets (12.5 mg total) by mouth in the morning., Disp: 15 tablet, Rfl: 0  Orders Placed This Encounter  Procedures  . Basic metabolic panel  . Magnesium  . Basic metabolic panel  . Magnesium  . EKG 12-Lead  . PCV ECHOCARDIOGRAM COMPLETE  . PCV LOWER VENOUS US (BILATERAL)    There are no Patient Instructions on file for this visit.   --Continue cardiac medications as reconciled in final medication list. --Return in about 7 weeks (around 01/13/2020) for HTN , review test results.. Or sooner if needed. --Continue follow-up with your primary care physician regarding the management of your other chronic comorbid conditions.  Patient's questions and concerns were addressed to her satisfaction. She voices understanding of the instructions provided during this encounter.   This note was created using a voice recognition software as a result there may be grammatical errors inadvertently enclosed that do not reflect the nature of this encounter. Every attempt is made to correct such errors.  Rex Kras, [place], [facility]  Pager:  [phone] Office: [phone]

## 2019-12-03 LAB — BASIC METABOLIC PANEL
BUN/Creatinine Ratio: 11 (ref 9–23)
BUN: 11 mg/dL (ref 6–24)
CO2: 28 mmol/L (ref 20–29)
Calcium: 9.5 mg/dL (ref 8.7–10.2)
Chloride: 101 mmol/L (ref 96–106)
Creatinine, Ser: 0.97 mg/dL (ref 0.57–1.00)
GFR calc Af Amer: 77 mL/min/{1.73_m2} (ref >59)
GFR calc non Af Amer: 66 mL/min/{1.73_m2} (ref >59)
Glucose: 104 mg/dL — ABNORMAL HIGH (ref 65–99)
Potassium: 4.3 mmol/L (ref 3.5–5.2)
Sodium: 141 mmol/L (ref 134–144)

## 2019-12-03 LAB — MAGNESIUM: Magnesium: 1.8 mg/dL (ref 1.6–2.3)

## 2019-12-09 ENCOUNTER — Ambulatory Visit: Payer: 59

## 2019-12-09 ENCOUNTER — Other Ambulatory Visit: Payer: Self-pay

## 2019-12-09 DIAGNOSIS — M7989 Other specified soft tissue disorders: Secondary | ICD-10-CM

## 2019-12-09 DIAGNOSIS — I10 Essential (primary) hypertension: Secondary | ICD-10-CM

## 2019-12-28 ENCOUNTER — Other Ambulatory Visit: Payer: Self-pay | Admitting: Cardiology

## 2019-12-28 DIAGNOSIS — I10 Essential (primary) hypertension: Secondary | ICD-10-CM

## 2020-01-13 ENCOUNTER — Ambulatory Visit: Payer: 59 | Admitting: Cardiology

## 2020-01-13 ENCOUNTER — Telehealth: Payer: Self-pay | Admitting: General Practice

## 2020-01-13 NOTE — Telephone Encounter (Signed)
Patient called in stating she's having some abd pain and her blood pressure is elevated.  She is going to call her pcp, because they are much closer.  She wanted me to document this in her chart so [name] could know this when she returns for her f/u visit.

## 2020-01-20 ENCOUNTER — Other Ambulatory Visit: Payer: Self-pay

## 2020-01-20 ENCOUNTER — Ambulatory Visit: Payer: 59 | Admitting: Cardiology

## 2020-01-20 ENCOUNTER — Encounter: Payer: Self-pay | Admitting: Cardiology

## 2020-01-20 VITALS — BP 132/83 | HR 71 | Ht 63.0 in | Wt 261.0 lb

## 2020-01-20 DIAGNOSIS — I10 Essential (primary) hypertension: Secondary | ICD-10-CM

## 2020-01-20 DIAGNOSIS — Z712 Person consulting for explanation of examination or test findings: Secondary | ICD-10-CM

## 2020-01-20 DIAGNOSIS — M7989 Other specified soft tissue disorders: Secondary | ICD-10-CM

## 2020-01-20 MED ORDER — CHLORTHALIDONE 25 MG PO TABS: 12.5000 mg | ORAL_TABLET | Freq: Every morning | ORAL | 0 refills | Status: DC

## 2020-01-20 NOTE — Progress Notes (Signed)
ID:  Leslie Rodriguez, DOB [birth year], MRN [id number]  PCP:  Lin Landsman, MD  Cardiologist:  Rex Kras, DO, [facility] (established care 11/25/2019)  Date: 01/20/2020 Last Office Visit: 11/25/2019  Chief Complaint  Patient presents with   Hypertension   Follow-up    HPI  Leslie Rodriguez is a 54 y.o. female who presents to the office with a chief complaint of " blood pressure management." Patient's past medical history and cardiovascular risk factors include: Benign essential hypertension, obesity due to excess calories.  Patient is referred to the office at the request of her primary care provider for evaluation of hypertension and lower extremity swelling.  Patient was initially evaluated back in August 2021 and at that time patient's blood pressure was not well controlled.  She was started on chlorthalidone 12.5 mg p.o. daily and recommended to have blood work done 1 week later.  Independently reviewed labs from December 02, 2019.  Kidney function and electrolytes remained within normal limits.  Since his titration of her medications patient states that her home systolic blood pressures range between 110-130 mmHg.  Pulse ranges between 50-70 bpm.  She is also started to walk on a regular basis approximately 2 blocks.  She has lost 6 pounds since last office visit.  She underwent an echocardiogram to evaluate for structural heart disease.  Her LVEF is preserved, grade 1 diastolic impairment, normal LAP, with moderate TR and RVSP of 36 mmHg.  In regards to lower extremity swelling patient states that her symptoms are essentially resolved since initiation of diuretic therapy.  Her lower extremity duplex was negative for DVT.  Denies prior history of coronary artery disease, myocardial infarction, congestive heart failure, deep venous thrombosis, pulmonary embolism, stroke, transient ischemic attack.  FUNCTIONAL STATUS: No structured exercise program or daily routine.   ALLERGIES: Allergies   Allergen Reactions   Ciprofloxacin Hcl Hives and Shortness Of Breath    MEDICATION LIST PRIOR TO VISIT: Current Meds  Medication Sig   acetaminophen (TYLENOL) 500 MG tablet Take 4 mg by mouth every 6 (six) hours as needed.   chlorthalidone (HYGROTON) 25 MG tablet Take 0.5 tablets (12.5 mg total) by mouth in the morning.   linaclotide (LINZESS) 72 MCG capsule Take 72 mcg by mouth daily before breakfast.   metoprolol succinate (TOPROL-XL) 100 MG 24 hr tablet Take 100 mg by mouth daily.   omeprazole (PRILOSEC) 20 MG capsule TAKE 1 CAPSULE BY MOUTH ONCE DAILY   [DISCONTINUED] chlorthalidone (HYGROTON) 25 MG tablet TAKE 1/2 TABLET(12.5 MG) BY MOUTH IN THE MORNING     PAST MEDICAL HISTORY: Past Medical History:  Diagnosis Date   Anxiety    Depression    Hypertension    Obesity     PAST SURGICAL HISTORY: Past Surgical History:  Procedure Laterality Date   ABDOMINAL HYSTERECTOMY  2003   [place]   COLONOSCOPY  01/16/2006   RMR: normal rectum/left sided transverse diverticula   COLONOSCOPY N/A 09/21/2015   Dr. Gala Romney: internal hemorrhoids, diverticulosis     FAMILY HISTORY: The patient family history includes Edema in her mother.  SOCIAL HISTORY:  The patient  reports that she has never smoked. She has never used smokeless tobacco. She reports current alcohol use. She reports that she does not use drugs.  REVIEW OF SYSTEMS: Review of Systems  Constitutional: Negative for chills and fever.  HENT: Negative for hoarse voice and nosebleeds.   Eyes: Negative for discharge, double vision and pain.  Cardiovascular: Negative for chest  pain, claudication, dyspnea on exertion, leg swelling, near-syncope, orthopnea, palpitations, paroxysmal nocturnal dyspnea and syncope.  Respiratory: Negative for hemoptysis and shortness of breath.   Musculoskeletal: Negative for muscle cramps and myalgias.  Gastrointestinal: Negative for abdominal pain, constipation,  diarrhea, hematemesis, hematochezia, melena, nausea and vomiting.  Neurological: Negative for dizziness and light-headedness.   PHYSICAL EXAM: Vitals with BMI 01/20/2020 11/25/2019 10/22/2019  Height 5' 3"  5' 3"  5' 3"   Weight 261 lbs 267 lbs 266 lbs 10 oz  BMI 46.25 07.62 26.33  Systolic 354 562 563  Diastolic 83 82 82  Pulse 71 67 68    CONSTITUTIONAL: Well-developed and well-nourished. No acute distress.  SKIN: Skin is warm and dry. No rash noted. No cyanosis. No pallor. No jaundice HEAD: Normocephalic and atraumatic.  EYES: No scleral icterus MOUTH/THROAT: Moist oral membranes.  NECK: No JVD present. No thyromegaly noted. No carotid bruits  LYMPHATIC: No visible cervical adenopathy.  CHEST Normal respiratory effort. No intercostal retractions  LUNGS: Clear to auscultation bilaterally.  No stridor. No wheezes. No rales.  CARDIOVASCULAR: Regular, positive S1-S2, no murmurs rubs or gallops appreciated. ABDOMINAL: Obese, soft, nontender, nondistended, positive bowel sounds in all 4 quadrants.  No apparent ascites.  EXTREMITIES: Pedal edema noted bilaterally.  Trace bilateral edema in the lower extremity.  2+ dorsalis pedis and posterior tibial pulses. HEMATOLOGIC: No significant bruising NEUROLOGIC: Oriented to person, place, and time. Nonfocal. Normal muscle tone.  PSYCHIATRIC: Normal mood and affect. Normal behavior. Cooperative  CARDIAC DATABASE: EKG:  11/25/2019: Normal sinus rhythm, 68 bpm, normal axis, incomplete right bundle branch block, nonspecific T wave abnormalities in the anteroseptal leads.  Echocardiogram: 12/09/2019:  Left ventricle cavity is normal in size. Moderate concentric hypertrophy of the left ventricle. Normal global wall motion. Normal LV systolic function with EF 61%. Doppler evidence of grade I (impaired) diastolic dysfunction, normal LAP.  Moderate tricuspid regurgitation. Estimated pulmonary artery systolic pressure 36 mmHg.   Stress  Testing: None  Heart Catheterization: None  Lower Extremity Venous Duplex 12/09/2019:  No evidence of deep vein thrombosis of the lower extremities with normal venous return.   LABORATORY DATA: External Labs: Collected: 02/19/2018 Creatinine 1.13 mg/dL. eGFR: 65 mL/min per 1.73 m  CBC Latest Ref Rng & Units 10/22/2019 05/30/2017 03/04/2017  WBC 3.8 - 10.8 Thousand/uL 5.8 5.4 6.3  Hemoglobin 11.7 - 15.5 g/dL 12.7 12.8 12.3  Hematocrit 35 - 45 % 38.1 39.5 38.4  Platelets 140 - 400 Thousand/uL 340 320 324    CMP Latest Ref Rng & Units 12/02/2019 05/30/2017 03/04/2017  Glucose 65 - 99 mg/dL 104(H) 123(H) 110(H)  BUN 6 - 24 mg/dL 11 13 15   Creatinine 0.57 - 1.00 mg/dL 0.97 1.13(H) 1.07(H)  Sodium 134 - 144 mmol/L 141 143 139  Potassium 3.5 - 5.2 mmol/L 4.3 3.9 3.6  Chloride 96 - 106 mmol/L 101 102 103  CO2 20 - 29 mmol/L 28 30 28   Calcium 8.7 - 10.2 mg/dL 9.5 9.4 9.0  Total Protein 6.5 - 8.1 g/dL - - 6.9  Total Bilirubin 0.3 - 1.2 mg/dL - - 0.1(L)  Alkaline Phos 38 - 126 U/L - - 59  AST 15 - 41 U/L - - 18  ALT 14 - 54 U/L - - 13(L)   IMPRESSION:    ICD-10-CM   1. Essential hypertension  I10 chlorthalidone (HYGROTON) 25 MG tablet  2. Encounter to discuss test results  Z71.2   3. Leg swelling  M79.89   4. Class 3 severe obesity due to excess  calories without serious comorbidity with body mass index (BMI) of 45.0 to 49.9 in adult [facility])  E66.01    Z68.42      RECOMMENDATIONS: ANISIA LEIJA is a 54 y.o. female whose past medical history and cardiac risk factors include: Hypertension and obesity.  Benign essential hypertension  Office blood pressure have improved.  Medication reconciled.   Independently reviewed the labs from August 2021.  Patient has increased her physical activity and reduce salt intake since last office visit.  Patient is congratulated on her efforts of losing 6 pounds as well.  Refilled chlorthalidone 12.5 mg p.o. daily  Lower extremity swelling:  Resolved.  Continue diuretic therapy.  Lower extremity duplex negative for DVT.    Obesity, due to excess calories: Body mass index is 46.23 kg/m.  I reviewed with the patient the importance of diet, regular physical activity/exercise, weight loss.    Patient is educated on increasing physical activity gradually as tolerated.  With the goal of moderate intensity exercise for 30 minutes a day 5 days a week.  During this encounter independently reviewed the labs from August 2021, echocardiogram results, and lower extremity duplex results.  Medications refilled.  Total encounter time 31 minutes.  FINAL MEDICATION LIST END OF ENCOUNTER: Meds ordered this encounter  Medications   chlorthalidone (HYGROTON) 25 MG tablet    Sig: Take 0.5 tablets (12.5 mg total) by mouth in the morning.    Dispense:  90 tablet    Refill:  0    Medications Discontinued During This Encounter  Medication Reason   chlorthalidone (HYGROTON) 25 MG tablet Reorder     Current Outpatient Medications:    acetaminophen (TYLENOL) 500 MG tablet, Take 4 mg by mouth every 6 (six) hours as needed., Disp: , Rfl:    chlorthalidone (HYGROTON) 25 MG tablet, Take 0.5 tablets (12.5 mg total) by mouth in the morning., Disp: 90 tablet, Rfl: 0   linaclotide (LINZESS) 72 MCG capsule, Take 72 mcg by mouth daily before breakfast., Disp: , Rfl:    metoprolol succinate (TOPROL-XL) 100 MG 24 hr tablet, Take 100 mg by mouth daily., Disp: , Rfl:    omeprazole (PRILOSEC) 20 MG capsule, TAKE 1 CAPSULE BY MOUTH ONCE DAILY, Disp: 30 capsule, Rfl: 3  No orders of the defined types were placed in this encounter.  There are no Patient Instructions on file for this visit.   --Continue cardiac medications as reconciled in final medication list. --Return in about 6 months (around 07/20/2020) for Follow up BP.. Or sooner if needed. --Continue follow-up with your primary care physician regarding the management of your other chronic  comorbid conditions.  Patient's questions and concerns were addressed to her satisfaction. She voices understanding of the instructions provided during this encounter.   This note was created using a voice recognition software as a result there may be grammatical errors inadvertently enclosed that do not reflect the nature of this encounter. Every attempt is made to correct such errors.  Rex Kras, [place], [facility]  Pager: [phone] Office: [phone]

## 2020-02-03 ENCOUNTER — Encounter: Payer: Self-pay | Admitting: Gastroenterology

## 2020-02-03 ENCOUNTER — Ambulatory Visit: Payer: 59 | Admitting: Gastroenterology

## 2020-02-03 ENCOUNTER — Other Ambulatory Visit: Payer: Self-pay

## 2020-02-03 VITALS — BP 121/81 | HR 69 | Temp 97.9°F | Ht 63.0 in | Wt 259.4 lb

## 2020-02-03 DIAGNOSIS — K625 Hemorrhage of anus and rectum: Secondary | ICD-10-CM

## 2020-02-03 DIAGNOSIS — K5900 Constipation, unspecified: Secondary | ICD-10-CM | POA: Insufficient documentation

## 2020-02-03 DIAGNOSIS — K581 Irritable bowel syndrome with constipation: Secondary | ICD-10-CM | POA: Insufficient documentation

## 2020-02-03 MED ORDER — LINACLOTIDE 290 MCG PO CAPS: 290.0000 ug | ORAL_CAPSULE | Freq: Every day | ORAL | 3 refills | Status: DC

## 2020-02-03 NOTE — Progress Notes (Signed)
Referring Provider: Leilani Able, MD Primary Care Physician:  Leilani Able, MD Primary GI: Dr. Jena Gauss   Chief Complaint  Patient presents with  . Rectal Bleeding    none recently  . Abdominal Pain    comes/goes  . Constipation    pcp gave samples of linzess and it help with Bm'S.     HPI:   Leslie Rodriguez is a 54 y.o. female presenting today with a history of history of GERD, constipation,  internal hemorrhoids s/p banding of left lateral, right posterior, right anterior in 2018. Last colonoscopy 2017. Last seen in July 2021 with concerns for bout with ischemic colitis prior to visit but had improved by time of visit. Here for routine follow-up.   Episode of lower abdominal pain associated with constipation recently.. Relief with Linzess 145 mcg, provided by PCP. Blood only with wiping. History of constipation in the past but had gotten better. Now worsening. Small Bristol stool scale #1. Some mucus. BM usually every 1-2 days. Tries not to strain. No N/V.   Past Medical History:  Diagnosis Date  . Anxiety   . Depression   . Hypertension   . Obesity     Past Surgical History:  Procedure Laterality Date  . ABDOMINAL HYSTERECTOMY  2003  . CESAREAN SECTION  1985  . COLONOSCOPY  01/16/2006   RMR: normal rectum/left sided transverse diverticula  . COLONOSCOPY N/A 09/21/2015   Dr. Jena Gauss: internal hemorrhoids, diverticulosis     Current Outpatient Medications  Medication Sig Dispense Refill  . acetaminophen (TYLENOL) 500 MG tablet Take 4 mg by mouth every 6 (six) hours as needed.    . chlorthalidone (HYGROTON) 25 MG tablet Take 0.5 tablets (12.5 mg total) by mouth in the morning. 90 tablet 0  . metoprolol succinate (TOPROL-XL) 100 MG 24 hr tablet Take 100 mg by mouth daily.    Marland Kitchen omeprazole (PRILOSEC) 20 MG capsule TAKE 1 CAPSULE BY MOUTH ONCE DAILY 30 capsule 3  . linaclotide (LINZESS) 290 MCG CAPS capsule Take 1 capsule (290 mcg total) by mouth daily before  breakfast. 30 capsule 3   No current facility-administered medications for this visit.    Allergies as of 02/03/2020 - Review Complete 02/03/2020  Allergen Reaction Noted  . Ciprofloxacin hcl Hives and Shortness Of Breath 07/27/2011    Family History  Problem Relation Age of Onset  . Edema Mother   . Colon cancer Neg Hx     Social History   Socioeconomic History  . Marital status: Single    Spouse name: Not on file  . Number of children: 4  . Years of education: Not on file  . Highest education level: Not on file  Occupational History  . Not on file  Tobacco Use  . Smoking status: Never Smoker  . Smokeless tobacco: Never Used  Vaping Use  . Vaping Use: Some days  Substance and Sexual Activity  . Alcohol use: Yes    Comment: socially  . Drug use: No  . Sexual activity: Not Currently  Other Topics Concern  . Not on file  Social History Narrative  . Not on file   Social Determinants of Health   Financial Resource Strain:   . Difficulty of Paying Living Expenses: Not on file  Food Insecurity:   . Worried About Programme researcher, broadcasting/film/video in the Last Year: Not on file  . Ran Out of Food in the Last Year: Not on file  Transportation Needs:   .  Lack of Transportation (Medical): Not on file  . Lack of Transportation (Non-Medical): Not on file  Physical Activity:   . Days of Exercise per Week: Not on file  . Minutes of Exercise per Session: Not on file  Stress:   . Feeling of Stress : Not on file  Social Connections:   . Frequency of Communication with Friends and Family: Not on file  . Frequency of Social Gatherings with Friends and Family: Not on file  . Attends Religious Services: Not on file  . Active Member of Clubs or Organizations: Not on file  . Attends Banker Meetings: Not on file  . Marital Status: Not on file    Review of Systems: Gen: Denies fever, chills, anorexia. Denies fatigue, weakness, weight loss.  CV: Denies chest pain, palpitations,  syncope, peripheral edema, and claudication. Resp: Denies dyspnea at rest, cough, wheezing, coughing up blood, and pleurisy. GI: see HPI Derm: Denies rash, itching, dry skin Psych: Denies depression, anxiety, memory loss, confusion. No homicidal or suicidal ideation.  Heme: see HPI  Physical Exam: BP 121/81   Pulse 69   Temp 97.9 F (36.6 C)   Ht 5\' 3"  (1.6 m)   Wt 259 lb 6.4 oz (117.7 kg)   BMI 45.95 kg/m  General:   Alert and oriented. No distress noted. Pleasant and cooperative.  Head:  Normocephalic and atraumatic. Eyes:  Conjuctiva clear without scleral icterus. Mouth:  Mask in place Cardiac: clear bilaterally Lungs: S1 S2 present without murmurs Abdomen:  +BS, soft, non-tender and non-distended. No rebound or guarding. No HSM or masses noted. Msk:  Symmetrical without gross deformities. Normal posture. Extremities:  Without edema. Neurologic:  Alert and  oriented x4 Psych:  Alert and cooperative. Normal mood and affect.  ASSESSMENT: [name] is a 54 y.o. female presenting today with history of rectal bleeding over the summer that clinically seemed most consistent with ischemic colitis; she had clinical resolution by time of appointment with 57. Last colonoscopy in 2017 with diverticulosis, and she underwent hemorrhoid banding in 2018. Low threshold for colonoscopy.  Returns today for routine follow-up, with recurrent constipation (previously struggled with this a few years ago but had been doing well), and noting small volume hematochezia with wiping. Improvement with abdominal discomfort in setting of constipation after taking Linzess 145 mcg by PCP.   Will start Linzess 290 mcg daily, as I feel she could benefit from maximized dosing. Will pursue early interval colonoscopy due to low-volume bleeding. Likely benign anorectal source in setting of constipation but needs diagnostic evaluation.    PLAN:  Proceed with colonoscopy by Dr. 2019 in near future using  PROPOFOL: the risks, benefits, and alternatives have been discussed with the patient in detail. The patient states understanding and desires to proceed.  Linzess 290 mcg daily samples provided and prescription sent  Further recommendations to follow   Jena Gauss, PhD, ANP-BC [facility]

## 2020-02-03 NOTE — Patient Instructions (Signed)
I have provided samples of Linzess and sent in a prescription. Start taking Linzess 1 capsule 30 minutes before breakfast daily. It is normal to have some looser stool starting out for the first few days, but this should improve. If it does not, please call us, as we will need to adjust the dosage.   Please call if any further episodes of abdominal pain.   We are arranging a colonoscopy in the near future!  Further recommendations to follow!   I enjoyed seeing you again today! As you know, I value our relationship and want to provide genuine, compassionate, and quality care. I welcome your feedback. If you receive a survey regarding your visit,  I greatly appreciate you taking time to fill this out. See you next time!  Gelene Mink, PhD, ANP-BC [facility]

## 2020-02-04 ENCOUNTER — Telehealth: Payer: Self-pay | Admitting: *Deleted

## 2020-02-04 NOTE — Telephone Encounter (Signed)
Called pt. She has been scheduled for TCS with propofol, Dr. Jena Gauss, ASA 3 on 1/31 at 1:00pm. Aware will mail pre-op/covid appt with prep instructions. Confirmed mailing address.

## 2020-02-09 ENCOUNTER — Telehealth: Payer: Self-pay

## 2020-02-09 NOTE — Telephone Encounter (Signed)
Pt called and said Linzess is covered under her insurance with a copay of $100.00. I mailed a Linzess copay card to pt. Pt will call back if she isn't able to use the copay card.

## 2020-03-09 ENCOUNTER — Encounter: Payer: Self-pay | Admitting: *Deleted

## 2020-03-09 ENCOUNTER — Telehealth: Payer: Self-pay | Admitting: *Deleted

## 2020-03-09 NOTE — Telephone Encounter (Signed)
PA approved via [facility] website for procedure. Auth# [id number]. Dates: Mar 31, 2020 - Jun 29, 2020

## 2020-03-09 NOTE — Telephone Encounter (Signed)
Called pt. Offered sooner procedure date. She agree'd, patient scheduled for 12/16 at 12:45pm. Aware will mail new instructions with new pre-op/covid test appt. She voiced understanding.

## 2020-03-28 NOTE — Patient Instructions (Signed)
Leslie Rodriguez  03/28/2020     @PREFPERIOPPHARMACY @   Your procedure is scheduled on  03/31/2020.  Report to 04/02/2020 at  1115  A.M.  Call this number if you have problems the morning of surgery:  [phone]   Remember:  Follow the diet and prep instructions given to you by the office.                       Take these medicines the morning of surgery with A SIP OF WATER  Metoprolol, omeprazole.    Do not wear jewelry, make-up or nail polish.  Do not wear lotions, powders, or perfumes. Please wear deodorant and brush your teeth.  Do not shave 48 hours prior to surgery.  Men may shave face and neck.  Do not bring valuables to the hospital.  [facility] is not responsible for any belongings or valuables.  Contacts, dentures or bridgework may not be worn into surgery.  Leave your suitcase in the car.  After surgery it may be brought to your room.  For patients admitted to the hospital, discharge time will be determined by your treatment team.  Patients discharged the day of surgery will not be allowed to drive home.   Name and phone number of your driver:   family Special instructions:  DO NOT smoke the morning of your procedure.  Please read over the following fact sheets that you were given. Anesthesia Post-op Instructions and Care and Recovery After Surgery      Monitored Anesthesia Care, Care After These instructions provide you with information about caring for yourself after your procedure. Your health care provider may also give you more specific instructions. Your treatment has been planned according to current medical practices, but problems sometimes occur. Call your health care provider if you have any problems or questions after your procedure. What can I expect after the procedure? After your procedure, you may:  Feel sleepy for several hours.  Feel clumsy and have poor balance for several hours.  Feel forgetful about what happened after the  procedure.  Have poor judgment for several hours.  Feel nauseous or vomit.  Have a sore throat if you had a breathing tube during the procedure. Follow these instructions at home: For at least 24 hours after the procedure:      Have a responsible adult stay with you. It is important to have someone help care for you until you are awake and alert.  Rest as needed.  Do not: ? Participate in activities in which you could fall or become injured. ? Drive. ? Use heavy machinery. ? Drink alcohol. ? Take sleeping pills or medicines that cause drowsiness. ? Make important decisions or sign legal documents. ? Take care of children on your own. Eating and drinking  Follow the diet that is recommended by your health care provider.  If you vomit, drink water, juice, or soup when you can drink without vomiting.  Make sure you have little or no nausea before eating solid foods. General instructions  Take over-the-counter and prescription medicines only as told by your health care provider.  If you have sleep apnea, surgery and certain medicines can increase your risk for breathing problems. Follow instructions from your health care provider about wearing your sleep device: ? Anytime you are sleeping, including during daytime naps. ? While taking prescription pain medicines, sleeping medicines, or medicines that make you drowsy.  If you smoke, do  not smoke without supervision.  Keep all follow-up visits as told by your health care provider. This is important. Contact a health care provider if:  You keep feeling nauseous or you keep vomiting.  You feel light-headed.  You develop a rash.  You have a fever. Get help right away if:  You have trouble breathing. Summary  For several hours after your procedure, you may feel sleepy and have poor judgment.  Have a responsible adult stay with you for at least 24 hours or until you are awake and alert. This information is not  intended to replace advice given to you by your health care provider. Make sure you discuss any questions you have with your health care provider. Document Revised: 07/01/2017 Document Reviewed: 07/24/2015 Elsevier Patient Education  2020 [employer].

## 2020-03-29 ENCOUNTER — Telehealth: Payer: Self-pay | Admitting: *Deleted

## 2020-03-29 ENCOUNTER — Encounter (HOSPITAL_COMMUNITY): Payer: Self-pay

## 2020-03-29 ENCOUNTER — Encounter (HOSPITAL_COMMUNITY)
Admission: RE | Admit: 2020-03-29 | Discharge: 2020-03-29 | Disposition: A | Discharge status: Home / Self Care | Payer: 59 | Source: Ambulatory Visit | Attending: Internal Medicine | Admitting: Internal Medicine

## 2020-03-29 ENCOUNTER — Other Ambulatory Visit: Payer: Self-pay

## 2020-03-29 ENCOUNTER — Other Ambulatory Visit (HOSPITAL_COMMUNITY)
Admission: RE | Admit: 2020-03-29 | Discharge: 2020-03-29 | Disposition: A | Discharge status: Home / Self Care | Payer: 59 | Source: Ambulatory Visit | Attending: Internal Medicine | Admitting: Internal Medicine

## 2020-03-29 DIAGNOSIS — Z01812 Encounter for preprocedural laboratory examination: Secondary | ICD-10-CM | POA: Insufficient documentation

## 2020-03-29 DIAGNOSIS — Z20822 Contact with and (suspected) exposure to covid-19: Secondary | ICD-10-CM | POA: Insufficient documentation

## 2020-03-29 LAB — SARS CORONAVIRUS 2 (TAT 6-24 HRS): SARS Coronavirus 2: NEGATIVE

## 2020-03-29 NOTE — Telephone Encounter (Signed)
Called pt. She was agreeable to move procedure up on Thursday to 10:30am. Aware to arrive at 9:00am. Called endo and [facility]OV[facility] to make aware of change

## 2020-03-29 NOTE — Progress Notes (Signed)
   03/29/20 1425  OBSTRUCTIVE SLEEP APNEA  Have you ever been diagnosed with sleep apnea through a sleep study? No  Do you snore loudly (loud enough to be heard through closed doors)?  1  Do you often feel tired, fatigued, or sleepy during the daytime (such as falling asleep during driving or talking to someone)? 0  Has anyone observed you stop breathing during your sleep? 0  Do you have, or are you being treated for high blood pressure? 1  BMI more than 35 kg/m2? 1  Age > 50 (1-yes) 1  Neck circumference greater than:Female 16 inches or larger, Female 17inches or larger? 1  Female Gender (Yes=1) 0  Obstructive Sleep Apnea Score 5  Score 5 or greater  Results sent to PCP

## 2020-03-31 ENCOUNTER — Ambulatory Visit (HOSPITAL_COMMUNITY)
Admission: RE | Admit: 2020-03-31 | Discharge: 2020-03-31 | Disposition: A | Discharge status: Home / Self Care | Payer: 59 | Attending: Internal Medicine | Admitting: Internal Medicine

## 2020-03-31 ENCOUNTER — Ambulatory Visit (HOSPITAL_COMMUNITY): Payer: 59 | Admitting: Anesthesiology

## 2020-03-31 ENCOUNTER — Encounter (HOSPITAL_COMMUNITY)
Admission: RE | Disposition: A | Discharge status: Home / Self Care | Payer: Self-pay | Source: Home / Self Care | Attending: Internal Medicine

## 2020-03-31 ENCOUNTER — Encounter (HOSPITAL_COMMUNITY): Payer: Self-pay | Admitting: Internal Medicine

## 2020-03-31 DIAGNOSIS — Z79899 Other long term (current) drug therapy: Secondary | ICD-10-CM | POA: Diagnosis not present

## 2020-03-31 DIAGNOSIS — K644 Residual hemorrhoidal skin tags: Secondary | ICD-10-CM | POA: Insufficient documentation

## 2020-03-31 DIAGNOSIS — K921 Melena: Secondary | ICD-10-CM

## 2020-03-31 DIAGNOSIS — Q438 Other specified congenital malformations of intestine: Secondary | ICD-10-CM | POA: Insufficient documentation

## 2020-03-31 DIAGNOSIS — K5730 Diverticulosis of large intestine without perforation or abscess without bleeding: Secondary | ICD-10-CM | POA: Diagnosis not present

## 2020-03-31 DIAGNOSIS — I10 Essential (primary) hypertension: Secondary | ICD-10-CM | POA: Insufficient documentation

## 2020-03-31 DIAGNOSIS — K642 Third degree hemorrhoids: Secondary | ICD-10-CM | POA: Diagnosis not present

## 2020-03-31 DIAGNOSIS — Z881 Allergy status to other antibiotic agents status: Secondary | ICD-10-CM | POA: Diagnosis not present

## 2020-03-31 HISTORY — PX: COLONOSCOPY WITH PROPOFOL: SHX5780

## 2020-03-31 SURGERY — COLONOSCOPY WITH PROPOFOL
Anesthesia: General

## 2020-03-31 MED ORDER — KETAMINE HCL 10 MG/ML IJ SOLN
INTRAMUSCULAR | Status: DC | PRN
  Administered 2020-03-31: 20 mg via INTRAVENOUS

## 2020-03-31 MED ORDER — GLYCOPYRROLATE 0.2 MG/ML IJ SOLN
INTRAMUSCULAR | Status: DC | PRN
  Administered 2020-03-31: .1 mg via INTRAVENOUS

## 2020-03-31 MED ORDER — PROPOFOL 500 MG/50ML IV EMUL
INTRAVENOUS | Status: DC | PRN
  Administered 2020-03-31: 140 ug/kg/min via INTRAVENOUS

## 2020-03-31 MED ORDER — LIDOCAINE HCL (CARDIAC) PF 100 MG/5ML IV SOSY
PREFILLED_SYRINGE | INTRAVENOUS | Status: DC | PRN
  Administered 2020-03-31: 50 mg via INTRAVENOUS

## 2020-03-31 MED ORDER — LACTATED RINGERS IV SOLN: INTRAVENOUS | Status: DC

## 2020-03-31 MED ORDER — STERILE WATER FOR IRRIGATION IR SOLN
Status: DC | PRN
  Administered 2020-03-31: 11:00:00 1.5 mL

## 2020-03-31 MED ORDER — PROPOFOL 10 MG/ML IV BOLUS
INTRAVENOUS | Status: DC | PRN
  Administered 2020-03-31: 60 mg via INTRAVENOUS

## 2020-03-31 MED ORDER — KETAMINE HCL 50 MG/5ML IJ SOSY
PREFILLED_SYRINGE | INTRAMUSCULAR | Status: AC
  Filled 2020-03-31: qty 5

## 2020-03-31 NOTE — Op Note (Signed)
[facility] Patient Name: Leslie Rodriguez Procedure Date: 03/31/2020 10:26 AM MRN: [id number] Date of Birth: Mar 25, 1966 Attending MD: Gennette Pacobert Michael Yochanan Eddleman , MD CSN: [id number] Age: 54 Admit Type: Outpatient Procedure:                Colonoscopy Indications:              Hematochezia Providers:                Gennette Pacobert Michael Rosela Supak, MD, Buel ReamAngela A. Thomasena Edisollins RN, RN,                            Pandora LeiterNeville David, Technician Referring MD:              Medicines:                Propofol per Anesthesia Complications:            No immediate complications. Estimated Blood Loss:     Estimated blood loss: none. Procedure:                Pre-Anesthesia Assessment:                           - Prior to the procedure, a History and Physical                            was performed, and patient medications and                            allergies were reviewed. The patient's tolerance of                            previous anesthesia was also reviewed. The risks                            and benefits of the procedure and the sedation                            options and risks were discussed with the patient.                            All questions were answered, and informed consent                            was obtained. Prior Anticoagulants: The patient has                            taken no previous anticoagulant or antiplatelet                            agents. ASA Grade Assessment: II - A patient with                            mild systemic disease. After reviewing the risks  and benefits, the patient was deemed in                            satisfactory condition to undergo the procedure.                           After obtaining informed consent, the colonoscope                            was passed under direct vision. Throughout the                            procedure, the patient's blood pressure, pulse, and                            oxygen saturations were  monitored continuously. The                            CF-HQ190L ([id number]) scope was introduced through                            the anus and advanced to the the cecum, identified                            by appendiceal orifice and ileocecal valve. The                            colonoscopy was performed without difficulty. The                            patient tolerated the procedure well. The quality                            of the bowel preparation was adequate. Scope In: 10:34:02 AM Scope Out: 10:53:54 AM Scope Withdrawal Time: 0 hours 7 minutes 5 seconds  Total Procedure Duration: 0 hours 19 minutes 52 seconds  Findings:      Hemorrhoids were found on perianal exam. Redundant colon. External       abdominal pressure and changing of the patient's position required to       reach the cecum.      Multiple large-mouthed diverticula were found in the entire colon.      The exam was otherwise without abnormality on direct and retroflexion       views.      External and internal hemorrhoids were found during retroflexion. The       hemorrhoids were moderate, large and Grade III (internal hemorrhoids       that prolapse but require manual reduction). Impression:               - Hemorrhoids found on perianal exam.                           - Diverticulosis in the entire examined colon.  Redundant colon.                           - The examination was otherwise normal on direct                            and retroflexion views.                           - External and internal hemorrhoids.                           - No specimens collected. Linzess has helped her                            constipation dramatically. She has significant                            diverticular disease. We will add a fiber                            supplement. She may benefit from hemorrhoid banding                            in the near future. Moderate Sedation:       Moderate (conscious) sedation was personally administered by an       anesthesia professional. The following parameters were monitored: oxygen       saturation, heart rate, blood pressure, respiratory rate, EKG, adequacy       of pulmonary ventilation, and response to care. Recommendation:           - Patient has a contact number available for                            emergencies. The signs and symptoms of potential                            delayed complications were discussed with the                            patient. Return to normal activities tomorrow.                            Written discharge instructions were provided to the                            patient.                           - Advance diet as tolerated.                           - Continue present medications. Add Benefiber 1                            tablespoon daily for 3 weeks then increase to  2                            tablespoons daily thereafter                           - Repeat colonoscopy in 10 years for screening                            purposes.                           - Return to GI clinic in 2 months. Pamphlet on                            hemorrhoid banding provided. Procedure Code(s):        --- Professional ---                           [phone], Colonoscopy, flexible; diagnostic, including                            collection of specimen(s) by brushing or washing,                            when performed (separate procedure) Diagnosis Code(s):        --- Professional ---                           P38.2, Third degree hemorrhoids                           K92.1, Melena (includes Hematochezia)                           K57.30, Diverticulosis of large intestine without                            perforation or abscess without bleeding CPT copyright 2019 American Medical Association. All rights reserved. The codes documented in this report are preliminary and upon coder review may  be revised  to meet current compliance requirements. Gerrit Friends. Jeremie Abdelaziz, MD Gennette Pac, MD 03/31/2020 11:09:46 AM This report has been signed electronically. Number of Addenda: 0

## 2020-03-31 NOTE — Discharge Instructions (Signed)
Colonoscopy Discharge Instructions  Read the instructions outlined below and refer to this sheet in the next few weeks. These discharge instructions provide you with general information on caring for yourself after you leave the hospital. Your doctor may also give you specific instructions. While your treatment has been planned according to the most current medical practices available, unavoidable complications occasionally occur. If you have any problems or questions after discharge, call Dr. Jena Gaussourk at [phone]. ACTIVITY  You may resume your regular activity, but move at a slower pace for the next 24 hours.   Take frequent rest periods for the next 24 hours.   Walking will help get rid of the air and reduce the bloated feeling in your belly (abdomen).   No driving for 24 hours (because of the medicine (anesthesia) used during the test).    Do not sign any important legal documents or operate any machinery for 24 hours (because of the anesthesia used during the test).  NUTRITION  Drink plenty of fluids.   You may resume your normal diet as instructed by your doctor.   Begin with a light meal and progress to your normal diet. Heavy or fried foods are harder to digest and may make you feel sick to your stomach (nauseated).   Avoid alcoholic beverages for 24 hours or as instructed.  MEDICATIONS  You may resume your normal medications unless your doctor tells you otherwise.  WHAT YOU CAN EXPECT TODAY  Some feelings of bloating in the abdomen.   Passage of more gas than usual.   Spotting of blood in your stool or on the toilet paper.  IF YOU HAD POLYPS REMOVED DURING THE COLONOSCOPY:  No aspirin products for 7 days or as instructed.   No alcohol for 7 days or as instructed.   Eat a soft diet for the next 24 hours.  FINDING OUT THE RESULTS OF YOUR TEST Not all test results are available during your visit. If your test results are not back during the visit, make an appointment  with your caregiver to find out the results. Do not assume everything is normal if you have not heard from your caregiver or the medical facility. It is important for you to follow up on all of your test results.  SEEK IMMEDIATE MEDICAL ATTENTION IF:  You have more than a spotting of blood in your stool.   Your belly is swollen (abdominal distention).   You are nauseated or vomiting.   You have a temperature over 101.   You have abdominal pain or discomfort that is severe or gets worse throughout the day.    Diverticulosis and hemorrhoid information provided. Hemorrhoid banding pamphlet provided  Continue Linzess 290 daily  Add Benefiber 1 tablespoon daily for 3 weeks-then increase to 2 tablespoons daily thereafter  May benefit from hemorrhoid banding  Office visit with Lewie Loronnna Boone in 2 months  Repeat colonoscopy for screening purposes in 10 years  Patient request, I called Linwood DibblesKanesha Watkins at [phone]    Diverticulosis  Diverticulosis is a condition that develops when small pouches (diverticula) form in the wall of the large intestine (colon). The colon is where water is absorbed and stool (feces) is formed. The pouches form when the inside layer of the colon pushes through weak spots in the outer layers of the colon. You may have a few pouches or many of them. The pouches usually do not cause problems unless they become inflamed or infected. When this happens, the condition is called diverticulitis.  What are the causes? The cause of this condition is not known. What increases the risk? The following factors may make you more likely to develop this condition:  Being older than age 40. Your risk for this condition increases with age. Diverticulosis is rare among people younger than age 16. By age 43, many people have it.  Eating a low-fiber diet.  Having frequent constipation.  Being overweight.  Not getting enough exercise.  Smoking.  Taking over-the-counter pain  medicines, like aspirin and ibuprofen.  Having a family history of diverticulosis. What are the signs or symptoms? In most people, there are no symptoms of this condition. If you do have symptoms, they may include:  Bloating.  Cramps in the abdomen.  Constipation or diarrhea.  Pain in the lower left side of the abdomen. How is this diagnosed? Because diverticulosis usually has no symptoms, it is most often diagnosed during an exam for other colon problems. The condition may be diagnosed by:  Using a flexible scope to examine the colon (colonoscopy).  Taking an X-ray of the colon after dye has been put into the colon (barium enema).  Having a CT scan. How is this treated? You may not need treatment for this condition. Your health care provider may recommend treatment to prevent problems. You may need treatment if you have symptoms or if you previously had diverticulitis. Treatment may include:  Eating a high-fiber diet.  Taking a fiber supplement.  Taking a live bacteria supplement (probiotic).  Taking medicine to relax your colon. Follow these instructions at home: Medicines  Take over-the-counter and prescription medicines only as told by your health care provider.  If told by your health care provider, take a fiber supplement or probiotic. Constipation prevention Your condition may cause constipation. To prevent or treat constipation, you may need to:  Drink enough fluid to keep your urine pale yellow.  Take over-the-counter or prescription medicines.  Eat foods that are high in fiber, such as beans, whole grains, and fresh fruits and vegetables.  Limit foods that are high in fat and processed sugars, such as fried or sweet foods.  General instructions  Try not to strain when you have a bowel movement.  Keep all follow-up visits as told by your health care provider. This is important. Contact a health care provider if you:  Have pain in your abdomen.  Have  bloating.  Have cramps.  Have not had a bowel movement in 3 days. Get help right away if:  Your pain gets worse.  Your bloating becomes very bad.  You have a fever or chills, and your symptoms suddenly get worse.  You vomit.  You have bowel movements that are bloody or black.  You have bleeding from your rectum. Summary  Diverticulosis is a condition that develops when small pouches (diverticula) form in the wall of the large intestine (colon).  You may have a few pouches or many of them.  This condition is most often diagnosed during an exam for other colon problems.  Treatment may include increasing the fiber in your diet, taking supplements, or taking medicines. This information is not intended to replace advice given to you by your health care provider. Make sure you discuss any questions you have with your health care provider. Document Revised: 10/30/2018 Document Reviewed: 10/30/2018 Elsevier Patient Education  2020 [employer].     Hemorrhoids Hemorrhoids are swollen veins in and around the rectum or anus. There are two types of hemorrhoids:  Internal hemorrhoids. These  occur in the veins that are just inside the rectum. They may poke through to the outside and become irritated and painful.  External hemorrhoids. These occur in the veins that are outside the anus and can be felt as a painful swelling or hard lump near the anus. Most hemorrhoids do not cause serious problems, and they can be managed with home treatments such as diet and lifestyle changes. If home treatments do not help the symptoms, procedures can be done to shrink or remove the hemorrhoids. What are the causes? This condition is caused by increased pressure in the anal area. This pressure may result from various things, including:  Constipation.  Straining to have a bowel movement.  Diarrhea.  Pregnancy.  Obesity.  Sitting for long periods of time.  Heavy lifting or other activity  that causes you to strain.  Anal sex.  Riding a bike for a long period of time. What are the signs or symptoms? Symptoms of this condition include:  Pain.  Anal itching or irritation.  Rectal bleeding.  Leakage of stool (feces).  Anal swelling.  One or more lumps around the anus. How is this diagnosed? This condition can often be diagnosed through a visual exam. Other exams or tests may also be done, such as:  An exam that involves feeling the rectal area with a gloved hand (digital rectal exam).  An exam of the anal canal that is done using a small tube (anoscope).  A blood test, if you have lost a significant amount of blood.  A test to look inside the colon using a flexible tube with a camera on the end (sigmoidoscopy or colonoscopy). How is this treated? This condition can usually be treated at home. However, various procedures may be done if dietary changes, lifestyle changes, and other home treatments do not help your symptoms. These procedures can help make the hemorrhoids smaller or remove them completely. Some of these procedures involve surgery, and others do not. Common procedures include:  Rubber band ligation. Rubber bands are placed at the base of the hemorrhoids to cut off their blood supply.  Sclerotherapy. Medicine is injected into the hemorrhoids to shrink them.  Infrared coagulation. A type of light energy is used to get rid of the hemorrhoids.  Hemorrhoidectomy surgery. The hemorrhoids are surgically removed, and the veins that supply them are tied off.  Stapled hemorrhoidopexy surgery. The surgeon staples the base of the hemorrhoid to the rectal wall. Follow these instructions at home: Eating and drinking   Eat foods that have a lot of fiber in them, such as whole grains, beans, nuts, fruits, and vegetables.  Ask your health care provider about taking products that have added fiber (fiber supplements).  Reduce the amount of fat in your diet. You  can do this by eating low-fat dairy products, eating less red meat, and avoiding processed foods.  Drink enough fluid to keep your urine pale yellow. Managing pain and swelling   Take warm sitz baths for 20 minutes, 3-4 times a day to ease pain and discomfort. You may do this in a bathtub or using a portable sitz bath that fits over the toilet.  If directed, apply ice to the affected area. Using ice packs between sitz baths may be helpful. ? Put ice in a plastic bag. ? Place a towel between your skin and the bag. ? Leave the ice on for 20 minutes, 2-3 times a day. General instructions  Take over-the-counter and prescription medicines only as told  by your health care provider.  Use medicated creams or suppositories as told.  Get regular exercise. Ask your health care provider how much and what kind of exercise is best for you. In general, you should do moderate exercise for at least 30 minutes on most days of the week (150 minutes each week). This can include activities such as walking, biking, or yoga.  Go to the bathroom when you have the urge to have a bowel movement. Do not wait.  Avoid straining to have bowel movements.  Keep the anal area dry and clean. Use wet toilet paper or moist towelettes after a bowel movement.  Do not sit on the toilet for long periods of time. This increases blood pooling and pain.  Keep all follow-up visits as told by your health care provider. This is important. Contact a health care provider if you have:  Increasing pain and swelling that are not controlled by treatment or medicine.  Difficulty having a bowel movement, or you are unable to have a bowel movement.  Pain or inflammation outside the area of the hemorrhoids. Get help right away if you have:  Uncontrolled bleeding from your rectum. Summary  Hemorrhoids are swollen veins in and around the rectum or anus.  Most hemorrhoids can be managed with home treatments such as diet and  lifestyle changes.  Taking warm sitz baths can help ease pain and discomfort.  In severe cases, procedures or surgery can be done to shrink or remove the hemorrhoids. This information is not intended to replace advice given to you by your health care provider. Make sure you discuss any questions you have with your health care provider. Document Revised: 08/29/2018 Document Reviewed: 08/22/2017 Elsevier Patient Education  2020 [employer].

## 2020-03-31 NOTE — Anesthesia Preprocedure Evaluation (Signed)
Anesthesia Evaluation  Patient identified by MRN, date of birth, ID band Patient awake    Reviewed: Allergy & Precautions, H&P , NPO status , Patient's Chart, lab work & pertinent test results, reviewed documented beta blocker date and time   Airway Mallampati: IV  TM Distance: >3 FB Neck ROM: full  Mouth opening: Limited Mouth Opening  Dental no notable dental hx. (+) Teeth Intact   Pulmonary neg pulmonary ROS,    Pulmonary exam normal breath sounds clear to auscultation       Cardiovascular Exercise Tolerance: Good hypertension, negative cardio ROS   Rhythm:regular Rate:Normal     Neuro/Psych PSYCHIATRIC DISORDERS Anxiety Depression negative neurological ROS     GI/Hepatic Neg liver ROS, GERD  Medicated,  Endo/Other  Morbid obesity  Renal/GU negative Renal ROS  negative genitourinary   Musculoskeletal   Abdominal   Peds  Hematology negative hematology ROS (+)   Anesthesia Other Findings   Reproductive/Obstetrics negative OB ROS                             Anesthesia Physical Anesthesia Plan  ASA: III  Anesthesia Plan: General   Post-op Pain Management:    Induction:   PONV Risk Score and Plan: Propofol infusion  Airway Management Planned:   Additional Equipment:   Intra-op Plan:   Post-operative Plan:   Informed Consent: I have reviewed the patients History and Physical, chart, labs and discussed the procedure including the risks, benefits and alternatives for the proposed anesthesia with the patient or authorized representative who has indicated his/her understanding and acceptance.     Dental Advisory Given  Plan Discussed with: CRNA  Anesthesia Plan Comments:         Anesthesia Quick Evaluation

## 2020-03-31 NOTE — Anesthesia Postprocedure Evaluation (Signed)
Anesthesia Post Note  Patient: Leslie Rodriguez  Procedure(s) Performed: COLONOSCOPY WITH PROPOFOL (N/A )  Patient location during evaluation: PACU Anesthesia Type: General and MAC Level of consciousness: awake and alert Pain management: pain level controlled Vital Signs Assessment: post-procedure vital signs reviewed and stable Respiratory status: spontaneous breathing Cardiovascular status: stable Postop Assessment: no apparent nausea or vomiting Anesthetic complications: no   No complications documented.   Last Vitals:  Vitals:   03/31/20 0945 03/31/20 1000  BP: 120/78   Pulse:  91  Resp: 19 10  Temp: 36.7 C   SpO2: 99% 99%    Last Pain:  Vitals:   03/31/20 1028  PainSc: 0-No pain                 Everette Rank

## 2020-03-31 NOTE — H&P (Signed)
@LOGO @   Primary Care Physician:  , MD Primary Gastroenterologist:  Dr. Leilani Able  Pre-Procedure History & Physical: HPI:  Leslie Rodriguez is a 54 y.o. female here for further evaluation of her recent rectal bleeding in setting of constipation.  Diverticulosis on prior colonoscopy 2017 along with internal hemorrhoids-previously banded.  Linzess 290-working well for constipation.  Past Medical History:  Diagnosis Date  . Anxiety   . Depression   . Hypertension   . Obesity     Past Surgical History:  Procedure Laterality Date  . ABDOMINAL HYSTERECTOMY  2003  . CESAREAN SECTION  1985  . COLONOSCOPY  01/16/2006   RMR: normal rectum/left sided transverse diverticula  . COLONOSCOPY N/A 09/21/2015   Dr. 11/21/2015: internal hemorrhoids, diverticulosis     Prior to Admission medications   Medication Sig Start Date End Date Taking? Authorizing Provider  acetaminophen (TYLENOL) 500 MG tablet Take 1,000 mg by mouth every 8 (eight) hours as needed for moderate pain.   Yes [provider]  chlorthalidone (HYGROTON) 25 MG tablet Take 0.5 tablets (12.5 mg total) by mouth in the morning. 01/20/20 07/18/20 Yes Tolia, Sunit, DO  linaclotide (LINZESS) 290 MCG CAPS capsule Take 1 capsule (290 mcg total) by mouth daily before breakfast. 02/03/20  Yes 02/05/20, NP  metoprolol succinate (TOPROL-XL) 100 MG 24 hr tablet Take 100 mg by mouth daily. 07/08/14  Yes [provider]  omeprazole (PRILOSEC) 20 MG capsule TAKE 1 CAPSULE BY MOUTH ONCE DAILY Patient taking differently: Take 20 mg by mouth daily. 06/13/17  Yes 06/15/17, NP    Allergies as of 02/04/2020 - Review Complete 02/03/2020  Allergen Reaction Noted  . Ciprofloxacin hcl Hives and Shortness Of Breath 07/27/2011    Family History  Problem Relation Age of Onset  . Edema Mother   . Colon cancer Neg Hx     Social History   Socioeconomic History  . Marital status: Single    Spouse name: Not on file  . Number of  children: 4  . Years of education: Not on file  . Highest education level: Not on file  Occupational History  . Not on file  Tobacco Use  . Smoking status: Never Smoker  . Smokeless tobacco: Never Used  Vaping Use  . Vaping Use: Never used  Substance and Sexual Activity  . Alcohol use: Yes    Comment: socially  . Drug use: No  . Sexual activity: Not Currently  Other Topics Concern  . Not on file  Social History Narrative  . Not on file   Social Determinants of Health   Financial Resource Strain: Not on file  Food Insecurity: Not on file  Transportation Needs: Not on file  Physical Activity: Not on file  Stress: Not on file  Social Connections: Not on file  Intimate Partner Violence: Not on file    Review of Systems: See HPI, otherwise negative ROS  Physical Exam: There were no vitals taken for this visit. General:   Alert,  Well-developed, well-nourished, pleasant and cooperative in NAD Ssignificant cervical adenopathy. Lungs:  Clear throughout to auscultation.   No wheezes, crackles, or rhonchi. No acute distress. Heart:  Regular rate and rhythm; no murmurs, clicks, rubs,  or gallops. Abdomen: Non-distended, normal bowel sounds.  Soft and nontender without appreciable mass or hepatosplenomegaly.  Pulses:  Normal pulses noted. Extremities:  Without clubbing or edema.  Impression/Plan: 53 year old lady with paper hematochezia in the setting of constipation here for diagnostic colonoscopy.  The risks, benefits, limitations, alternatives and imponderables have been reviewed with the patient. Questions have been answered. All parties are agreeable.      Notice: This dictation was prepared with Dragon dictation along with smaller phrase technology. Any transcriptional errors that result from this process are unintentional and may not be corrected upon review.

## 2020-03-31 NOTE — Transfer of Care (Signed)
Immediate Anesthesia Transfer of Care Note  Patient: Leslie Rodriguez  Procedure(s) Performed: COLONOSCOPY WITH PROPOFOL (N/A )  Patient Location: PACU  Anesthesia Type:MAC and General  Level of Consciousness: awake, alert , oriented and patient cooperative  Airway & Oxygen Therapy: Patient Spontanous Breathing  Post-op Assessment: Report given to RN and Post -op Vital signs reviewed and stable  Post vital signs: Reviewed and stable  Last Vitals:  Vitals Value Taken Time  BP 110/73 03/31/20 1102  Temp    Pulse    Resp 19 03/31/20 1102  SpO2    Vitals shown include unvalidated device data.  Last Pain:  Vitals:   03/31/20 1028  PainSc: 0-No pain         Complications: No complications documented.

## 2020-04-07 ENCOUNTER — Encounter (HOSPITAL_COMMUNITY): Payer: Self-pay | Admitting: Internal Medicine

## 2020-05-13 ENCOUNTER — Other Ambulatory Visit (HOSPITAL_COMMUNITY): Payer: 59

## 2020-05-24 ENCOUNTER — Telehealth: Payer: Self-pay | Admitting: Internal Medicine

## 2020-05-24 NOTE — Telephone Encounter (Signed)
PATIENT Leslie Rodriguez [phone], CALLED WITH A QUESTION FOR HER MOTHER

## 2020-05-24 NOTE — Telephone Encounter (Signed)
Noted. Spoke with pts daughter, she was asked to have her mother take Miralax 1-2 times daily and increase her water intake.

## 2020-05-24 NOTE — Telephone Encounter (Signed)
Miralax daily to BID as needed. Increase water intake. Enjoy the cruise!

## 2020-05-24 NOTE — Telephone Encounter (Signed)
Spoke with pts daughter. Pt reached out to her daughter via text due to being on a cruise. Pt hasn't had a bowel movement in 3 days. Pt is taking Linzess 290 mcg one tab qam before breakfast, taking Benefiber daily and eating fruits/vegetables. Pt only mentioned mild discomfort per daughter. Pt would like to know if there is anything else she can do to have a bowel movement.

## 2020-05-31 NOTE — Progress Notes (Unsigned)
Primary Care Physician:  Leilani Able, MD  Primary GI: Dr. Jena Gauss  Patient Location: Home   Provider Location: [facility] office   Reason for Visit: Abdominal pain, bleeding   Persons present on the virtual encounter, with roles: Patient and NP   Total time (minutes) spent on medical discussion:   Due to COVID-19, visit was conducted using virtual method.  Visit was requested by patient.  Virtual Visit via MyChart Video Note Due to COVID-19, visit is conducted virtually and was requested by patient.   I connected with [name] on 06/01/20 at 10:30 AM EST by video and verified that I am speaking with the correct person using two identifiers.   I discussed the limitations, risks, security and privacy concerns of performing an evaluation and management service by telephone and the availability of in person appointments. I also discussed with the patient that there may be a patient responsible charge related to this service. The patient expressed understanding and agreed to proceed.  Chief Complaint  Patient presents with  . Rectal Bleeding    Bleeding since TCS. Has blood when has gas also. Has been bleeding approx 10 days  . Abdominal Pain     History of Present Illness: Leslie Rodriguez is a 55 year old female with a history of GERD, constipation, internal hemorrhoids s/p banding in 2018. Colonoscopy Dec 2021: hemorrhoids, pancolonic diverticulosis, redundant colon. Felt to possibly have episode of ischemic colitis last year.  Recently on a cruise last week. Called in with constipation despite Linzess 290 mcg daily. She states that she was actually having LLQ abdominal pain and bleeding as well daily for close to a week. Lots of gas. Only blood and gas coming out at times. Had small amount of bleeding prior to cruise. Denies straining. Eating soft foods. Pain was in LLQ and now improving but still present. Bleeding tapering off. Feels weak and tired. No fever or  chills. Was craving ice while on the cruise.   Past Medical History:  Diagnosis Date  . Anxiety   . Depression   . Hypertension   . Obesity      Past Surgical History:  Procedure Laterality Date  . ABDOMINAL HYSTERECTOMY  2003  . CESAREAN SECTION  1985  . COLONOSCOPY  01/16/2006   RMR: normal rectum/left sided transverse diverticula  . COLONOSCOPY N/A 09/21/2015   Dr. Jena Gauss: internal hemorrhoids, diverticulosis   . COLONOSCOPY WITH PROPOFOL N/A 03/31/2020   hemorrhoids, pancolonic diverticulosis, redundant colon.      Current Meds  Medication Sig  . acetaminophen (TYLENOL) 500 MG tablet Take 1,000 mg by mouth every 8 (eight) hours as needed for moderate pain.  . chlorthalidone (HYGROTON) 25 MG tablet Take 0.5 tablets (12.5 mg total) by mouth in the morning.  . linaclotide (LINZESS) 290 MCG CAPS capsule Take 1 capsule (290 mcg total) by mouth daily before breakfast.  . metoprolol succinate (TOPROL-XL) 100 MG 24 hr tablet Take 100 mg by mouth daily.  Marland Kitchen omeprazole (PRILOSEC) 20 MG capsule TAKE 1 CAPSULE BY MOUTH ONCE DAILY (Patient taking differently: Take 20 mg by mouth daily.)     Family History  Problem Relation Age of Onset  . Edema Mother   . Colon cancer Neg Hx     Social History   Socioeconomic History  . Marital status: Single    Spouse name: Not on file  . Number of children: 4  . Years of education: Not on file  . Highest  education level: Not on file  Occupational History  . Not on file  Tobacco Use  . Smoking status: Never Smoker  . Smokeless tobacco: Never Used  Vaping Use  . Vaping Use: Never used  Substance and Sexual Activity  . Alcohol use: Yes    Comment: socially  . Drug use: No  . Sexual activity: Not Currently  Other Topics Concern  . Not on file  Social History Narrative  . Not on file   Social Determinants of Health   Financial Resource Strain: Not on file  Food Insecurity: Not on file  Transportation Needs: Not on file  Physical  Activity: Not on file  Stress: Not on file  Social Connections: Not on file       Review of Systems: Gen: Denies fever, chills, anorexia. Denies fatigue, weakness, weight loss.  CV: Denies chest pain, palpitations, syncope, peripheral edema, and claudication. Resp: Denies dyspnea at rest, cough, wheezing, coughing up blood, and pleurisy. GI: see HPI Derm: Denies rash, itching, dry skin Psych: Denies depression, anxiety, memory loss, confusion. No homicidal or suicidal ideation.  Heme: Denies bruising, bleeding, and enlarged lymph nodes.  Observations/Objective: No distress. Pleasant and cooperative.  Engaging.   Assessment and Plan: 55 year old female with history of constipation,GERD, recent colonoscopy Dec 2021: hemorrhoids, pancolonic diverticulosis, redundant colon. Felt to possibly have episode of ischemic colitis last year.  Returning in follow-up with reports of recurrent abdominal pain, predominantly LLQ and associated with bleeding while on cruise. Abdominal pain and bleeding are tapering off, but she does note that she feels tired and has been craving ice. Afebrile, no chills. She does endorse constipation during this episode.  Rectal bleeding seems to be out of proportion for only hemorrhoids. Symptoms seem classic for bout of ischemic colitis. Encouragingly, she has had improvement symptomatically but abdominal discomfort still present. Diverticular bleed unable to be excluded but would be less likely as this is normally painless; however, if she had diverticulitis episode as well, could certainly present as such.   Ordering CT abd/pelvis with contrast. CBC with diff ordered. Continue with Linzess 290 mcg daily, and add Miralax daily as needed. Avoid constipation. Further recommendations after imaging and blood work. Return in 3 months regardless.     Follow Up Instructions:    I discussed the assessment and treatment plan with the patient. The patient was provided an  opportunity to ask questions and all were answered. The patient agreed with the plan and demonstrated an understanding of the instructions.   The patient was advised to call back or seek an in-person evaluation if the symptoms worsen or if the condition fails to improve as anticipated.  I provided 15 minutes of face-to-face time during this MyChart Video encounter.  Gelene Mink, PhD, ANP-BC [facility]

## 2020-06-01 ENCOUNTER — Telehealth: Payer: Self-pay | Admitting: *Deleted

## 2020-06-01 ENCOUNTER — Encounter: Payer: Self-pay | Admitting: Internal Medicine

## 2020-06-01 ENCOUNTER — Encounter: Payer: Self-pay | Admitting: Gastroenterology

## 2020-06-01 ENCOUNTER — Telehealth (INDEPENDENT_AMBULATORY_CARE_PROVIDER_SITE_OTHER): Payer: Self-pay | Admitting: Gastroenterology

## 2020-06-01 DIAGNOSIS — K625 Hemorrhage of anus and rectum: Secondary | ICD-10-CM

## 2020-06-01 DIAGNOSIS — R1032 Left lower quadrant pain: Secondary | ICD-10-CM

## 2020-06-01 NOTE — Patient Instructions (Signed)
We are arranging a CT scan. Please have blood work done at time of CT scan.  Continue Linzess, and take Miralax on any given day as needed!  We will see you in 3 months! Further recommendations to follow after scan and blood work.   I enjoyed seeing you again today! As you know, I value our relationship and want to provide genuine, compassionate, and quality care. I welcome your feedback. If you receive a survey regarding your visit,  I greatly appreciate you taking time to fill this out. See you next time!  Gelene Mink, PhD, ANP-BC [facility]

## 2020-06-01 NOTE — Telephone Encounter (Signed)
Leslie Rodriguez, you are scheduled for a virtual visit with your provider today.  Just as we do with appointments in the office, we must obtain your consent to participate.  Your consent will be active for this visit and any virtual visit you may have with one of our providers in the next 365 days.  If you have a MyChart account, I can also send a copy of this consent to you electronically.  All virtual visits are billed to your insurance company just like a traditional visit in the office.  As this is a virtual visit, video technology does not allow for your provider to perform a traditional examination.  This may limit your provider's ability to fully assess your condition.  If your provider identifies any concerns that need to be evaluated in person or the need to arrange testing such as labs, EKG, etc, we will make arrangements to do so.  Although advances in technology are sophisticated, we cannot ensure that it will always work on either your end or our end.  If the connection with a video visit is poor, we may have to switch to a telephone visit.  With either a video or telephone visit, we are not always able to ensure that we have a secure connection.   I need to obtain your verbal consent now.   Are you willing to proceed with your visit today?

## 2020-06-01 NOTE — Telephone Encounter (Signed)
Pt consented to a virtual visit. 

## 2020-06-03 ENCOUNTER — Telehealth: Payer: Self-pay | Admitting: Internal Medicine

## 2020-06-03 NOTE — Telephone Encounter (Signed)
Spoke with pt. Pt's information was mailed from the video visit on 06/01/2020 by [facility] CMA. [facility] clinical please arrange CT scan. See Lewie Loron NP notes.

## 2020-06-03 NOTE — Telephone Encounter (Signed)
Correction. Left a detailed message for pt.

## 2020-06-03 NOTE — Telephone Encounter (Signed)
Pt called to see when her next labs and CT were due. Please advise. [phone]

## 2020-06-06 LAB — CBC WITH DIFFERENTIAL/PLATELET
Absolute Monocytes: 540 cells/uL (ref 200–950)
Basophils Absolute: 33 cells/uL (ref 0–200)
Basophils Relative: 0.5 %
Eosinophils Absolute: 91 cells/uL (ref 15–500)
Eosinophils Relative: 1.4 %
HCT: 38.6 % (ref 35.0–45.0)
Hemoglobin: 12.8 g/dL (ref 11.7–15.5)
Lymphs Abs: 2906 cells/uL (ref 850–3900)
MCH: 30.5 pg (ref 27.0–33.0)
MCHC: 33.2 g/dL (ref 32.0–36.0)
MCV: 92.1 fL (ref 80.0–100.0)
MPV: 10.3 fL (ref 7.5–12.5)
Monocytes Relative: 8.3 %
Neutro Abs: 2932 cells/uL (ref 1500–7800)
Neutrophils Relative %: 45.1 %
Platelets: 348 10*3/uL (ref 140–400)
RBC: 4.19 10*6/uL (ref 3.80–5.10)
RDW: 13.8 % (ref 11.0–15.0)
Total Lymphocyte: 44.7 %
WBC: 6.5 10*3/uL (ref 3.8–10.8)

## 2020-06-06 NOTE — Telephone Encounter (Signed)
LMOVM for pt. CT scheduled for 2/22 at 4:00pm, arriva 3:45pm, npo 4 hrs and p/u oral contrast from [facility] today. If not, need to arrive at 1pm tomorrow.  Called pt and Left detailed message on VM regarding appointment details and phone # if she needs to r/s.

## 2020-06-07 ENCOUNTER — Ambulatory Visit (HOSPITAL_COMMUNITY)
Admission: RE | Admit: 2020-06-07 | Discharge: 2020-06-07 | Disposition: A | Discharge status: Home / Self Care | Payer: BLUE CROSS/BLUE SHIELD | Source: Ambulatory Visit | Attending: Gastroenterology | Admitting: Gastroenterology

## 2020-06-07 ENCOUNTER — Other Ambulatory Visit: Payer: Self-pay

## 2020-06-07 ENCOUNTER — Telehealth: Payer: Self-pay | Admitting: Internal Medicine

## 2020-06-07 DIAGNOSIS — K625 Hemorrhage of anus and rectum: Secondary | ICD-10-CM | POA: Insufficient documentation

## 2020-06-07 DIAGNOSIS — R1032 Left lower quadrant pain: Secondary | ICD-10-CM | POA: Insufficient documentation

## 2020-06-07 LAB — POCT I-STAT CREATININE: Creatinine, Ser: 1 mg/dL (ref 0.44–1.00)

## 2020-06-07 MED ORDER — IOHEXOL 300 MG/ML  SOLN
100.0000 mL | Freq: Once | INTRAMUSCULAR | Status: AC | PRN
  Administered 2020-06-07: 100 mL via INTRAVENOUS

## 2020-06-07 NOTE — Telephone Encounter (Signed)
Patient dropped off fmla papers to be filled out

## 2020-06-08 ENCOUNTER — Other Ambulatory Visit: Payer: Self-pay

## 2020-06-08 DIAGNOSIS — N83202 Unspecified ovarian cyst, left side: Secondary | ICD-10-CM

## 2020-06-09 ENCOUNTER — Ambulatory Visit (HOSPITAL_COMMUNITY)
Admission: RE | Admit: 2020-06-09 | Discharge: 2020-06-09 | Disposition: A | Discharge status: Home / Self Care | Payer: BLUE CROSS/BLUE SHIELD | Source: Ambulatory Visit | Attending: Gastroenterology | Admitting: Gastroenterology

## 2020-06-09 ENCOUNTER — Other Ambulatory Visit: Payer: Self-pay

## 2020-06-09 DIAGNOSIS — N83202 Unspecified ovarian cyst, left side: Secondary | ICD-10-CM | POA: Insufficient documentation

## 2020-06-09 NOTE — Telephone Encounter (Signed)
Called and spoke with the pt, got additional information. Advised her that I am working on [employer].

## 2020-06-10 NOTE — Telephone Encounter (Signed)
Paperwork done and left on [employer].

## 2020-06-13 ENCOUNTER — Other Ambulatory Visit: Payer: Self-pay

## 2020-06-13 ENCOUNTER — Telehealth: Payer: Self-pay

## 2020-06-13 ENCOUNTER — Other Ambulatory Visit: Payer: Self-pay | Admitting: *Deleted

## 2020-06-13 DIAGNOSIS — N83202 Unspecified ovarian cyst, left side: Secondary | ICD-10-CM

## 2020-06-13 NOTE — Telephone Encounter (Signed)
Addressed in result notes  

## 2020-06-13 NOTE — Telephone Encounter (Signed)
Noted  

## 2020-06-13 NOTE — Telephone Encounter (Signed)
FORM FEE ENTERED INTO Epic

## 2020-06-13 NOTE — Telephone Encounter (Signed)
Received a call report from [facility]. Impression- Surgical absence of uterus with nonvisualization of RIGHT ovary.  Complicated cystic lesion 3.7 cm diameter within LEFT ovary, containing multiple loculations and thin septations.  This is an indeterminate though probably benign lesion; surgical evaluation is recommended.  These results will be called to the ordering clinician or representative by the Radiologist Assistant, and communication documented in the PACS or [employer].

## 2020-06-14 NOTE — Telephone Encounter (Signed)
Copies made for scanning. Original in the drawer for pick up.

## 2020-06-19 ENCOUNTER — Other Ambulatory Visit: Payer: Self-pay | Admitting: Gastroenterology

## 2020-07-06 ENCOUNTER — Encounter: Payer: Self-pay | Admitting: Obstetrics

## 2020-07-06 ENCOUNTER — Ambulatory Visit: Payer: BLUE CROSS/BLUE SHIELD | Admitting: Obstetrics

## 2020-07-06 ENCOUNTER — Other Ambulatory Visit: Payer: Self-pay

## 2020-07-06 ENCOUNTER — Other Ambulatory Visit (HOSPITAL_COMMUNITY)
Admission: RE | Admit: 2020-07-06 | Discharge: 2020-07-06 | Disposition: A | Discharge status: Home / Self Care | Payer: BLUE CROSS/BLUE SHIELD | Source: Ambulatory Visit | Attending: Obstetrics | Admitting: Obstetrics

## 2020-07-06 VITALS — BP 123/84 | HR 61 | Ht 63.0 in | Wt 259.0 lb

## 2020-07-06 DIAGNOSIS — N83202 Unspecified ovarian cyst, left side: Secondary | ICD-10-CM | POA: Diagnosis not present

## 2020-07-06 DIAGNOSIS — R102 Pelvic and perineal pain: Secondary | ICD-10-CM | POA: Diagnosis not present

## 2020-07-06 DIAGNOSIS — N760 Acute vaginitis: Secondary | ICD-10-CM | POA: Insufficient documentation

## 2020-07-06 DIAGNOSIS — B9689 Other specified bacterial agents as the cause of diseases classified elsewhere: Secondary | ICD-10-CM | POA: Insufficient documentation

## 2020-07-06 DIAGNOSIS — Z6841 Body Mass Index (BMI) 40.0 and over, adult: Secondary | ICD-10-CM

## 2020-07-06 DIAGNOSIS — Z90710 Acquired absence of both cervix and uterus: Secondary | ICD-10-CM | POA: Diagnosis not present

## 2020-07-06 DIAGNOSIS — N898 Other specified noninflammatory disorders of vagina: Secondary | ICD-10-CM | POA: Diagnosis not present

## 2020-07-06 MED ORDER — IBUPROFEN 800 MG PO TABS: 800.0000 mg | ORAL_TABLET | Freq: Three times a day (TID) | ORAL | 5 refills | Status: DC | PRN

## 2020-07-06 NOTE — Progress Notes (Signed)
Patient ID: Leslie Rodriguez, female   DOB: [birth year], 55 y.o.   MRN: [id number]  No chief complaint on file.   HPI Leslie Rodriguez is a 55 y.o. female.  Presents in referral from GI for history of pelvic pain, and an ultrasound that revealed a complex left ovarian cyst.  She is s/p TAH in 2003 for fibroids and AUB.  HPI  Past Medical History:  Diagnosis Date  . Anxiety   . Depression   . Hypertension   . Obesity     Past Surgical History:  Procedure Laterality Date  . ABDOMINAL HYSTERECTOMY  2003  . CESAREAN SECTION  1985  . COLONOSCOPY  01/16/2006   RMR: normal rectum/left sided transverse diverticula  . COLONOSCOPY N/A 09/21/2015   Dr. Jena Gauss: internal hemorrhoids, diverticulosis   . COLONOSCOPY WITH PROPOFOL N/A 03/31/2020   hemorrhoids, pancolonic diverticulosis, redundant colon.     Family History  Problem Relation Age of Onset  . Edema Mother   . Colon cancer Neg Hx     Social History Social History   Tobacco Use  . Smoking status: Never Smoker  . Smokeless tobacco: Never Used  Vaping Use  . Vaping Use: Never used  Substance Use Topics  . Alcohol use: Yes    Comment: socially  . Drug use: No    Allergies  Allergen Reactions  . Ciprofloxacin Hcl Hives and Shortness Of Breath    Current Outpatient Medications  Medication Sig Dispense Refill  . chlorthalidone (HYGROTON) 25 MG tablet Take 0.5 tablets (12.5 mg total) by mouth in the morning. 90 tablet 0  . ibuprofen (ADVIL) 800 MG tablet Take 1 tablet (800 mg total) by mouth every 8 (eight) hours as needed. 30 tablet 5  . LINZESS 290 MCG CAPS capsule TAKE 1 CAPSULE(290 MCG) BY MOUTH DAILY BEFORE BREAKFAST 30 capsule 3  . metoprolol succinate (TOPROL-XL) 100 MG 24 hr tablet Take 100 mg by mouth daily.    Marland Kitchen acetaminophen (TYLENOL) 500 MG tablet Take 1,000 mg by mouth every 8 (eight) hours as needed for moderate pain.    Marland Kitchen LINZESS 290 MCG CAPS capsule TAKE 1 CAPSULE(290 MCG) BY MOUTH DAILY BEFORE BREAKFAST 90  capsule 3  . omeprazole (PRILOSEC) 20 MG capsule TAKE 1 CAPSULE BY MOUTH ONCE DAILY (Patient taking differently: Take 20 mg by mouth daily.) 30 capsule 3   No current facility-administered medications for this visit.    Review of Systems Review of Systems Constitutional: negative for fatigue and weight loss Respiratory: negative for cough and wheezing Cardiovascular: negative for chest pain, fatigue and palpitations Gastrointestinal: negative for abdominal pain and change in bowel habits Genitourinary: positive for pelvic pain Integument/breast: negative for nipple discharge Musculoskeletal:negative for myalgias Neurological: negative for gait problems and tremors Behavioral/Psych: negative for abusive relationship, depression Endocrine: negative for temperature intolerance      Blood pressure 123/84, pulse 61, height 5\' 3"  (1.6 m), weight 259 lb (117.5 kg).  Physical Exam Physical Exam General:   alert and no distress  Skin:   no rash or abnormalities  Lungs:   clear to auscultation bilaterally  Heart:   regular rate and rhythm, S1, S2 normal, no murmur, click, rub or gallop  Breasts:    Not examined  Abdomen:  normal findings: no organomegaly, soft, non-tender and no hernia  Pelvis:  External genitalia: normal general appearance Urinary system: urethral meatus normal and bladder without fullness, nontender Vaginal: normal without tenderness, induration or masses Cervix: absent Adnexa: left adnexal tenderness,  no masses Uterus: absent    I have spent a total of 20 minutes of face-to-face time, excluding clinical staff time, reviewing notes and preparing to see patient, ordering tests and/or medications, and counseling the patient.  Data Reviewed Ultrasound  US PELVIC COMPLETE WITH TRANSVAGINAL (Accession [id number]) (Order [id number]) Imaging Date: 06/09/2020 Department: [facility] Released By: Chester Holstein Authorizing: Gelene Mink, NP    Exam  Status  Status  Final [99]   PACS Intelerad Image Link  Show images for US PELVIC COMPLETE WITH TRANSVAGINAL  Study Result  Narrative & Impression  CLINICAL DATA:  LEFT ovarian cyst, follow-up abnormal CT  EXAM: TRANSABDOMINAL AND TRANSVAGINAL ULTRASOUND OF PELVIS  TECHNIQUE: Both transabdominal and transvaginal ultrasound examinations of the pelvis were performed. Transabdominal technique was performed for global imaging of the pelvis including uterus, ovaries, adnexal regions, and pelvic cul-de-sac. It was necessary to proceed with endovaginal exam following the transabdominal exam to visualize the ovaries.  COMPARISON:  CT abdomen and pelvis 06/07/2020, pelvic ultrasound 12/23/2012  FINDINGS: Uterus  Surgically absent  Endometrium  Not visualized, likely obscured by bowel  Right ovary  Not visualized, likely obscured by bowel  Left ovary  Measurements: 5.0 x 2.7 x 4.6 cm = volume: 32.4 mL. Complicated cystic lesion containing multiple locules and thin intervening septations, in aggregate 3.7 x 2.4 x 2.7 cm. Largest loculation measures 3.0 cm diameter. Minimal scattered internal echoes. No mural nodularity.  Other findings  No free pelvic fluid.  No other adnexal masses.  IMPRESSION: Surgical absence of uterus with nonvisualization of RIGHT ovary.  Complicated cystic lesion 3.7 cm diameter within LEFT ovary, containing multiple loculations and thin septations.  This is an indeterminate though probably benign lesion; surgical evaluation is recommended.  These results will be called to the ordering clinician or representative by the Radiologist Assistant, and communication documented in the PACS or [employer].   Electronically Signed   By: Ulyses Southward M.D.   On: 06/10/2020 13:23      Assessment     1. Cyst of left ovary, complex, with septations but no solid features Rx: - CA 125  2. Pelvic pain Rx: -  ibuprofen (ADVIL) 800 MG tablet; Take 1 tablet (800 mg total) by mouth every 8 (eight) hours as needed.  Dispense: 30 tablet; Refill: 5  3. Vaginal discharge Rx: - Cervicovaginal ancillary only( [place])  4. S/P abdominal hysterectomy  5. Class 3 severe obesity due to excess calories without serious comorbidity with body mass index (BMI) of 45.0 to 49.9 in adult [facility])     Plan   Follow up in 2 weeks for surgical consult with Dr. Alysia Penna  Orders Placed This Encounter  Procedures  . CA 125   Meds ordered this encounter  Medications  . ibuprofen (ADVIL) 800 MG tablet    Sig: Take 1 tablet (800 mg total) by mouth every 8 (eight) hours as needed.    Dispense:  30 tablet    Refill:  5     Brock Bad, MD 07/06/2020 9:51 AM

## 2020-07-07 ENCOUNTER — Other Ambulatory Visit: Payer: Self-pay | Admitting: Obstetrics

## 2020-07-07 DIAGNOSIS — B9689 Other specified bacterial agents as the cause of diseases classified elsewhere: Secondary | ICD-10-CM

## 2020-07-07 DIAGNOSIS — N760 Acute vaginitis: Secondary | ICD-10-CM

## 2020-07-07 LAB — CERVICOVAGINAL ANCILLARY ONLY
Bacterial Vaginitis (gardnerella): POSITIVE — AB
Candida Glabrata: NEGATIVE
Candida Vaginitis: NEGATIVE
Comment: NEGATIVE
Comment: NEGATIVE
Comment: NEGATIVE
Comment: NEGATIVE
Trichomonas: NEGATIVE

## 2020-07-07 LAB — CA 125: Cancer Antigen (CA) 125: 9.8 U/mL (ref 0.0–38.1)

## 2020-07-07 MED ORDER — METRONIDAZOLE 500 MG PO TABS: 500.0000 mg | ORAL_TABLET | Freq: Two times a day (BID) | ORAL | 2 refills | Status: DC

## 2020-07-14 ENCOUNTER — Telehealth: Payer: Self-pay | Admitting: Internal Medicine

## 2020-07-14 NOTE — Telephone Encounter (Signed)
Omeprazole 20mg daily.  °

## 2020-07-14 NOTE — Telephone Encounter (Signed)
Please send a refill of omeprazole to [facility] at [address] in [place]

## 2020-07-18 ENCOUNTER — Other Ambulatory Visit: Payer: Self-pay | Admitting: Gastroenterology

## 2020-07-18 MED ORDER — OMEPRAZOLE 20 MG PO CPDR: 20.0000 mg | DELAYED_RELEASE_CAPSULE | Freq: Every day | ORAL | 5 refills | Status: AC

## 2020-07-18 NOTE — Telephone Encounter (Signed)
Noted  

## 2020-07-18 NOTE — Telephone Encounter (Signed)
Rx sent 

## 2020-07-20 ENCOUNTER — Other Ambulatory Visit: Payer: Self-pay

## 2020-07-20 ENCOUNTER — Ambulatory Visit: Payer: 59 | Admitting: Cardiology

## 2020-07-20 ENCOUNTER — Ambulatory Visit: Payer: BLUE CROSS/BLUE SHIELD | Admitting: Obstetrics and Gynecology

## 2020-07-20 ENCOUNTER — Encounter: Payer: Self-pay | Admitting: Obstetrics and Gynecology

## 2020-07-20 ENCOUNTER — Telehealth: Payer: Self-pay | Admitting: Internal Medicine

## 2020-07-20 DIAGNOSIS — N83202 Unspecified ovarian cyst, left side: Secondary | ICD-10-CM

## 2020-07-20 DIAGNOSIS — N83209 Unspecified ovarian cyst, unspecified side: Secondary | ICD-10-CM | POA: Insufficient documentation

## 2020-07-20 NOTE — Progress Notes (Signed)
ID:  Leslie Rodriguez, DOB 1966-02-10, MRN [id number]  PCP:  Lin Landsman, MD  Cardiologist:  Rex Kras, DO, [facility] (established care 11/25/2019)  Date: 07/21/20 Last Office Visit: 01/20/2020  Chief Complaint  Patient presents with  . Hypertension  . Follow-up    6 months    HPI  Leslie Rodriguez is a 55 y.o. female who presents to the office with a chief complaint of "44-monthfollow-up for blood pressure management." Patient's past medical history and cardiovascular risk factors include: Benign essential hypertension, obesity due to excess calories.  Patient was referred to the office at the request of her primary care provider for evaluation of hypertension and lower extremity swelling.  Since last office visit patient states that her blood pressure is very well controlled.  When she checks her blood pressure at home she states her systolic blood pressures range between 120--130 mmHg.  She is tolerating the medications well without any side effects or intolerances.  She is trying to consume a low-salt diet and putting in the effort to increase her physical activity to facilitate weight loss.  Her lower extremity swelling is resolved after discontinuation of amlodipine.  FUNCTIONAL STATUS: No structured exercise program or daily routine.   ALLERGIES: Allergies  Allergen Reactions  . Ciprofloxacin Hcl Hives and Shortness Of Breath    MEDICATION LIST PRIOR TO VISIT: Current Meds  Medication Sig  . acetaminophen (TYLENOL) 500 MG tablet Take 1,000 mg by mouth every 8 (eight) hours as needed for moderate pain.  . chlorthalidone (HYGROTON) 25 MG tablet Take 0.5 tablets (12.5 mg total) by mouth in the morning.  .Marland KitchenLINZESS 290 MCG CAPS capsule TAKE 1 CAPSULE(290 MCG) BY MOUTH DAILY BEFORE BREAKFAST  . metoprolol succinate (TOPROL-XL) 100 MG 24 hr tablet Take 100 mg by mouth daily.  .Marland Kitchenomeprazole (PRILOSEC) 20 MG capsule Take 1 capsule (20 mg total) by mouth daily before breakfast.      PAST MEDICAL HISTORY: Past Medical History:  Diagnosis Date  . Anxiety   . Depression   . Hypertension   . Obesity     PAST SURGICAL HISTORY: Past Surgical History:  Procedure Laterality Date  . ABDOMINAL HYSTERECTOMY  2003  . CESAREAN SECTION  1985  . COLONOSCOPY  01/16/2006   RMR: normal rectum/left sided transverse diverticula  . COLONOSCOPY N/A 09/21/2015   Dr. RGala Romney internal hemorrhoids, diverticulosis   . COLONOSCOPY WITH PROPOFOL N/A 03/31/2020   hemorrhoids, pancolonic diverticulosis, redundant colon.     FAMILY HISTORY: The patient family history includes Edema in her mother.  SOCIAL HISTORY:  The patient  reports that she has never smoked. She has never used smokeless tobacco. She reports current alcohol use. She reports that she does not use drugs.  REVIEW OF SYSTEMS: Review of Systems  Constitutional: Negative for chills and fever.  HENT: Negative for hoarse voice and nosebleeds.   Eyes: Negative for discharge, double vision and pain.  Cardiovascular: Negative for chest pain, claudication, dyspnea on exertion, leg swelling, near-syncope, orthopnea, palpitations, paroxysmal nocturnal dyspnea and syncope.  Respiratory: Negative for hemoptysis and shortness of breath.   Musculoskeletal: Negative for muscle cramps and myalgias.  Gastrointestinal: Negative for abdominal pain, constipation, diarrhea, hematemesis, hematochezia, melena, nausea and vomiting.  Neurological: Negative for dizziness and light-headedness.   PHYSICAL EXAM: Vitals with BMI 07/21/2020 07/20/2020 07/06/2020  Height 5' 3"  - 5' 3"   Weight 262 lbs 10 oz 262 lbs 259 lbs  BMI 46.53 [phone] Systolic [id number] Diastolic  84 85 84  Pulse - 73 61    CONSTITUTIONAL: Well-developed and well-nourished. No acute distress.  SKIN: Skin is warm and dry. No rash noted. No cyanosis. No pallor. No jaundice HEAD: Normocephalic and atraumatic.  EYES: No scleral icterus MOUTH/THROAT: Moist oral  membranes.  NECK: No JVD present. No thyromegaly noted. No carotid bruits  LYMPHATIC: No visible cervical adenopathy.  CHEST Normal respiratory effort. No intercostal retractions  LUNGS: Clear to auscultation bilaterally.  No stridor. No wheezes. No rales.  CARDIOVASCULAR: Regular, positive S1-S2, no murmurs rubs or gallops appreciated. ABDOMINAL: Obese, soft, nontender, nondistended, positive bowel sounds in all 4 quadrants.  No apparent ascites.  EXTREMITIES: No lower extremity swelling.  2+ dorsalis pedis and posterior tibial pulses. HEMATOLOGIC: No significant bruising NEUROLOGIC: Oriented to person, place, and time. Nonfocal. Normal muscle tone.  PSYCHIATRIC: Normal mood and affect. Normal behavior. Cooperative  CARDIAC DATABASE: EKG:  07/21/2020: Normal sinus rhythm, 60 bpm, incomplete right bundle branch block, right atrial enlargement, without underlying injury pattern.  Echocardiogram: 12/09/2019:  Left ventricle cavity is normal in size. Moderate concentric hypertrophy of the left ventricle. Normal global wall motion. Normal LV systolic function with EF 61%. Doppler evidence of grade I (impaired) diastolic dysfunction, normal LAP. Moderate tricuspid regurgitation. Estimated pulmonary artery systolic pressure 36 mmHg.   Stress Testing: None  Heart Catheterization: None  Lower Extremity Venous Duplex 12/09/2019:  No evidence of deep vein thrombosis of the lower extremities with normal venous return.   LABORATORY DATA: External Labs: Collected: 02/19/2018 Creatinine 1.13 mg/dL. eGFR: 65 mL/min per 1.73 m  CBC Latest Ref Rng & Units 06/06/2020 10/22/2019 05/30/2017  WBC 3.8 - 10.8 Thousand/uL 6.5 5.8 5.4  Hemoglobin 11.7 - 15.5 g/dL 12.8 12.7 12.8  Hematocrit 35.0 - 45.0 % 38.6 38.1 39.5  Platelets 140 - 400 Thousand/uL 348 340 320    CMP Latest Ref Rng & Units 06/07/2020 12/02/2019 05/30/2017  Glucose 65 - 99 mg/dL - 104(H) 123(H)  BUN 6 - 24 mg/dL - 11 13  Creatinine 0.44  - 1.00 mg/dL 1.00 0.97 1.13(H)  Sodium 134 - 144 mmol/L - 141 143  Potassium 3.5 - 5.2 mmol/L - 4.3 3.9  Chloride 96 - 106 mmol/L - 101 102  CO2 20 - 29 mmol/L - 28 30  Calcium 8.7 - 10.2 mg/dL - 9.5 9.4  Total Protein 6.5 - 8.1 g/dL - - -  Total Bilirubin 0.3 - 1.2 mg/dL - - -  Alkaline Phos 38 - 126 U/L - - -  AST 15 - 41 U/L - - -  ALT 14 - 54 U/L - - -   IMPRESSION:    ICD-10-CM   1. Essential hypertension  I10 EKG 12-Lead  2. Class 3 severe obesity due to excess calories without serious comorbidity with body mass index (BMI) of 45.0 to 49.9 in adult [facility])  E66.01    Z68.42      RECOMMENDATIONS: Leslie Rodriguez is a 55 y.o. female whose past medical history and cardiac risk factors include: Hypertension and obesity.  Benign essential hypertension  Blood pressure very well controlled.    Recommend continuation of current medical therapy.    Patient does not request any refills at today's visit.    Low-salt diet recommended.    Educated on importance of increasing physical activity to goal of 30 minutes a day 5 days a week.  Obesity, due to excess calories: Body mass index is 46.52 kg/m. . I reviewed with the patient the importance  of diet, regular physical activity/exercise, weight loss.   . Patient is educated on increasing physical activity gradually as tolerated.  With the goal of moderate intensity exercise for 30 minutes a day 5 days a week.   FINAL MEDICATION LIST END OF ENCOUNTER: No orders of the defined types were placed in this encounter.   Medications Discontinued During This Encounter  Medication Reason  . LINZESS 290 MCG CAPS capsule Error     Current Outpatient Medications:  .  acetaminophen (TYLENOL) 500 MG tablet, Take 1,000 mg by mouth every 8 (eight) hours as needed for moderate pain., Disp: , Rfl:  .  chlorthalidone (HYGROTON) 25 MG tablet, Take 0.5 tablets (12.5 mg total) by mouth in the morning., Disp: 90 tablet, Rfl: 0 .  LINZESS 290 MCG  CAPS capsule, TAKE 1 CAPSULE(290 MCG) BY MOUTH DAILY BEFORE BREAKFAST, Disp: 90 capsule, Rfl: 3 .  metoprolol succinate (TOPROL-XL) 100 MG 24 hr tablet, Take 100 mg by mouth daily., Disp: , Rfl:  .  omeprazole (PRILOSEC) 20 MG capsule, Take 1 capsule (20 mg total) by mouth daily before breakfast., Disp: 30 capsule, Rfl: 5  Orders Placed This Encounter  Procedures  . EKG 12-Lead   There are no Patient Instructions on file for this visit.   --Continue cardiac medications as reconciled in final medication list. --Return in about 1 year (around 07/21/2021) for Follow up, BP. Or sooner if needed. --Continue follow-up with your primary care physician regarding the management of your other chronic comorbid conditions.  Patient's questions and concerns were addressed to her satisfaction. She voices understanding of the instructions provided during this encounter.   This note was created using a voice recognition software as a result there may be grammatical errors inadvertently enclosed that do not reflect the nature of this encounter. Every attempt is made to correct such errors.  Total time spent: 20 minutes.  Rex Kras, [place], [facility]  Pager: [phone] Office: [phone]

## 2020-07-20 NOTE — Telephone Encounter (Signed)
Pt called to say that her pharmacy wouldn't approve her omeprazole to be filled. I told her that it was sent yesterday and confirmed receipt that they received it. Does she need a PA? [phone]

## 2020-07-20 NOTE — Progress Notes (Signed)
Ms Dolezal presents in referral from Dr. Aron Baba for eval of left ovarian cyst. Ms Schmuck reports having left abd/pelvic pain accompanied with rectal bleeding. H/O diverticulosis. H/O TAH d/t uterine fibroids. Saw GI had normal colonoscopy. CT scan showed left ovarian cyst, f/u U/S confirmed, complex, @ 4x3x3. Normal Ca 125  PE AF VSS Lungs clear Heart RRR Abd soft +BS  A/P Left ovarian cyst.  Pt reassured that cyst is not the cause of her rectal bleeding. It could cause some pain but most likely not the primary cause. Discussed ovarian cysts with. Information provided as well. Suspect cyst is benign. We will repeat GYN U/S in May and follow up according to results.

## 2020-07-20 NOTE — Patient Instructions (Signed)
Ovarian Cyst  An ovarian cyst is a fluid-filled sac on an ovary. Most of these cysts go away on their own and are not cancer. Some cysts need treatment. What are the causes?  Ovarian hyperstimulation syndrome. Some medicines may lead to this problem.  Polycystic ovarian syndrome (PCOS). Problems with body chemicals (hormones) can lead to this condition.  The normal menstrual cycle. What increases the risk?  Being overweight or very overweight.  Taking medicines to increase your chance of getting pregnant.  Using some types of birth control.  Smoking. What are the signs or symptoms? Many ovarian cysts do not cause symptoms. If you get symptoms, you may have:  Pain or pressure in the area between the hip bones.  Pain in the lower belly.  Pain during sex.  Swelling in the lower belly.  Periods that are not regular.  Pain with periods. How is this treated? Many ovarian cysts go away on their own without treatment. If you need treatment, it may include:  Medicines for pain.  Fluid taken out of the cyst.  The cyst being taken out.  Birth control pills or other medicines.  Surgery to remove the ovary. Follow these instructions at home:  Take over-the-counter and prescription medicines only as told by your doctor.  Ask your doctor if you should avoid driving or using machines while you are taking your medicine.  Get exams and Pap tests as told by your doctor.  Return to your normal activities when your doctor says that it is safe.  Do not smoke or use any products that contain nicotine or tobacco. If you need help quitting, ask your doctor.  Keep all follow-up visits. Contact a doctor if:  Your periods: ? Are late. ? Are not regular. ? Stop. ? Are painful.  You have pain in the area between your hip bones, and the pain does not go away.  You feel pressure on your bladder.  You have trouble peeing.  You feel full, or your belly hurts, swells, or  bloats.  You gain or lose weight without trying, or you are less hungry than normal.  You feel pain and pressure in your back.  You feel pain and pressure in the area between your hip bones.  You think you may be pregnant. Get help right away if:  You have pain in your belly that is very bad or gets worse.  You have pain in the area between your hip bones, and the pain is very bad or gets worse.  You cannot eat or drink without vomiting.  You get a fever or chills all of a sudden.  Your period is a lot heavier than usual. Summary  An ovarian cyst is a fluid-filled sac on an ovary.  Some cysts may cause problems and need treatment.  Most of these cysts go away on their own. This information is not intended to replace advice given to you by your health care provider. Make sure you discuss any questions you have with your health care provider. Document Revised: 09/10/2019 Document Reviewed: 09/10/2019 Elsevier Patient Education  2021 Elsevier Inc.  

## 2020-07-20 NOTE — Telephone Encounter (Signed)
Spoke with pt. She needs to make sure the pharmacy has her new insurance. If not, she can use a goodrx card and get medication for $15.00 at [facility]. Pt will call back to update me with medication info.

## 2020-07-21 ENCOUNTER — Ambulatory Visit: Payer: BLUE CROSS/BLUE SHIELD | Admitting: Cardiology

## 2020-07-21 ENCOUNTER — Encounter: Payer: Self-pay | Admitting: Cardiology

## 2020-07-21 VITALS — BP 130/84 | Temp 98.0°F | Resp 16 | Ht 63.0 in | Wt 262.6 lb

## 2020-07-21 DIAGNOSIS — I10 Essential (primary) hypertension: Secondary | ICD-10-CM

## 2020-07-21 DIAGNOSIS — Z712 Person consulting for explanation of examination or test findings: Secondary | ICD-10-CM

## 2020-07-21 DIAGNOSIS — M7989 Other specified soft tissue disorders: Secondary | ICD-10-CM

## 2020-08-14 ENCOUNTER — Other Ambulatory Visit: Payer: Self-pay | Admitting: Cardiology

## 2020-08-14 DIAGNOSIS — I10 Essential (primary) hypertension: Secondary | ICD-10-CM

## 2020-08-22 ENCOUNTER — Ambulatory Visit
Admission: RE | Admit: 2020-08-22 | Discharge: 2020-08-22 | Disposition: A | Discharge status: Home / Self Care | Payer: BLUE CROSS/BLUE SHIELD | Source: Ambulatory Visit | Attending: Obstetrics and Gynecology | Admitting: Obstetrics and Gynecology

## 2020-08-22 DIAGNOSIS — N83202 Unspecified ovarian cyst, left side: Secondary | ICD-10-CM

## 2020-08-31 ENCOUNTER — Other Ambulatory Visit: Payer: Self-pay

## 2020-08-31 ENCOUNTER — Ambulatory Visit (INDEPENDENT_AMBULATORY_CARE_PROVIDER_SITE_OTHER): Payer: BLUE CROSS/BLUE SHIELD | Admitting: Gastroenterology

## 2020-08-31 ENCOUNTER — Encounter: Payer: Self-pay | Admitting: Gastroenterology

## 2020-08-31 VITALS — BP 125/83 | HR 56 | Temp 96.8°F | Ht 63.0 in | Wt 254.4 lb

## 2020-08-31 DIAGNOSIS — K219 Gastro-esophageal reflux disease without esophagitis: Secondary | ICD-10-CM

## 2020-08-31 DIAGNOSIS — K5900 Constipation, unspecified: Secondary | ICD-10-CM

## 2020-08-31 NOTE — Progress Notes (Signed)
Referring Provider: Leilani Able, MD Primary Care Physician:  Leilani Able, MD Primary GI: Dr. Jena Gauss    Chief Complaint  Patient presents with  . Rectal Bleeding    F/u, doing ok. No bleeding or abdominal pain    HPI:   Leslie Rodriguez is a 55 y.o. female presenting today with a history of GERD, constipation, internal hemorrhoids s/p banding in 2018. Colonoscopy Dec 2021: hemorrhoids, pancolonic diverticulosis, redundant colon. Felt to possibly have episode of ischemic colitis last year and most recently while on a cruise. Due to abdominal pain during visit in Feb 2022, CT was performed. This showed 4.4 cm complex cystic lesion in left ovary. Pelvic US then completed. Referred to GYN who followed. Repeat US in May without ability to visualize left ovary. Following serially.   No further rectal bleeding. Will have intermittent gas pains, stool will be hard, lower abdominal pain with bloating, pain about 3-4 days and then back to normal. Takes Linzess 290 mcg daily, which works overall unless having these episodes. Happens every few months. Will not eat as much when this happens. Will have a smoothie when this happens. Will have burping and swelling of abdomen.   New insurance difficulties with covering omeprazole. Now taking it OTC.    Past Medical History:  Diagnosis Date  . Anxiety   . Depression   . Hypertension   . Obesity     Past Surgical History:  Procedure Laterality Date  . ABDOMINAL HYSTERECTOMY  2003  . CESAREAN SECTION  1985  . COLONOSCOPY  01/16/2006   RMR: normal rectum/left sided transverse diverticula  . COLONOSCOPY N/A 09/21/2015   Dr. Jena Gauss: internal hemorrhoids, diverticulosis   . COLONOSCOPY WITH PROPOFOL N/A 03/31/2020   hemorrhoids, pancolonic diverticulosis, redundant colon.     Current Outpatient Medications  Medication Sig Dispense Refill  . acetaminophen (TYLENOL) 500 MG tablet Take 1,000 mg by mouth every 8 (eight) hours as needed for moderate  pain.    . chlorthalidone (HYGROTON) 25 MG tablet TAKE 1/2 TABLET BY MOUTH EVERY MORNING 90 tablet 0  . LINZESS 290 MCG CAPS capsule TAKE 1 CAPSULE(290 MCG) BY MOUTH DAILY BEFORE BREAKFAST 90 capsule 3  . metoprolol succinate (TOPROL-XL) 100 MG 24 hr tablet Take 100 mg by mouth daily.    Marland Kitchen omeprazole (PRILOSEC) 20 MG capsule Take 1 capsule (20 mg total) by mouth daily before breakfast. 30 capsule 5   No current facility-administered medications for this visit.    Allergies as of 08/31/2020 - Review Complete 08/31/2020  Allergen Reaction Noted  . Ciprofloxacin hcl Hives and Shortness Of Breath 07/27/2011    Family History  Problem Relation Age of Onset  . Edema Mother   . Colon cancer Neg Hx     Social History   Socioeconomic History  . Marital status: Single    Spouse name: Not on file  . Number of children: 4  . Years of education: Not on file  . Highest education level: Not on file  Occupational History  . Not on file  Tobacco Use  . Smoking status: Never Smoker  . Smokeless tobacco: Never Used  Vaping Use  . Vaping Use: Never used  Substance and Sexual Activity  . Alcohol use: Yes    Comment: socially  . Drug use: No  . Sexual activity: Not Currently  Other Topics Concern  . Not on file  Social History Narrative  . Not on file   Social Determinants of Health  Financial Resource Strain: Not on file  Food Insecurity: Not on file  Transportation Needs: Not on file  Physical Activity: Not on file  Stress: Not on file  Social Connections: Not on file    Review of Systems: Gen: Denies fever, chills, anorexia. Denies fatigue, weakness, weight loss.  CV: Denies chest pain, palpitations, syncope, peripheral edema, and claudication. Resp: Denies dyspnea at rest, cough, wheezing, coughing up blood, and pleurisy. GI: see HPI Derm: Denies rash, itching, dry skin Psych: Denies depression, anxiety, memory loss, confusion. No homicidal or suicidal ideation.  Heme:  Denies bruising, bleeding, and enlarged lymph nodes.  Physical Exam: BP 125/83   Pulse (!) 56   Temp (!) 96.8 F (36 C) (Temporal)   Ht 5\' 3"  (1.6 m)   Wt 254 lb 6.4 oz (115.4 kg)   BMI 45.06 kg/m  General:   Alert and oriented. No distress noted. Pleasant and cooperative.  Head:  Normocephalic and atraumatic. Eyes:  Conjuctiva clear without scleral icterus. Mouth:  Mask in place Abdomen:  +BS, soft, non-tender and non-distended. No rebound or guarding. No HSM or masses noted. Msk:  Symmetrical without gross deformities. Normal posture. Extremities:  Without edema. Neurologic:  Alert and  oriented x4 Psych:  Alert and cooperative. Normal mood and affect.  ASSESSMENT: Leslie Rodriguez is a 55 y.o. female presenting today in follow-up with history of GERD, constipation, internal hemorrhoids s/p banding in 2018, and colonoscopy updated as of Dec 2021.  Constipation: overall does well with Linzess 290 mcg daily but has occasional episodes of abdominal pain, distension, acute on chronic constipation, and burping. I query whether this is secondary to ileus and does not appear to be full out obstruction. I have asked her to call Jan 2022 when this happens again and will order an abdominal xray. May add Miralax daily to BID if needed for additional help in addition to Linzess going forward.   GERD: controlled with omeprazole daily.  Left ovarian cyst: followed by GYN.    PLAN:  Linzess 290 mcg daily, may add Miralax as needed Call if recurrent abdominal pain: will order xray Return in 6 months regardless   [place], PhD, ANP-BC [facility]

## 2020-08-31 NOTE — Patient Instructions (Signed)
Continue Linzess daily.   If you have bouts of constipation, you can take Miralax 1 capful in 8 ounces of water, once to twice a day.  If you have another episode with bloating, abdominal distension, burping, etc, please call me. We will do an xray.  We will see you in 6 months!  I enjoyed seeing you again today! As you know, I value our relationship and want to provide genuine, compassionate, and quality care. I welcome your feedback. If you receive a survey regarding your visit,  I greatly appreciate you taking time to fill this out. See you next time!  Gelene Mink, PhD, ANP-BC [facility]

## 2020-09-05 ENCOUNTER — Telehealth: Payer: Self-pay

## 2020-09-05 NOTE — Telephone Encounter (Signed)
TC to pt to make aware of U/S findings pt not ava lvm

## 2020-09-05 NOTE — Telephone Encounter (Signed)
-----   Message from Hermina Staggers, MD sent at 09/02/2020 10:29 AM EDT ----- Please schedule follow up complete GYN U/S for left ovarian cyst in 2 months Pt is aware  Thanks Casimiro Needle

## 2020-10-24 ENCOUNTER — Telehealth: Payer: Self-pay

## 2020-10-24 DIAGNOSIS — N83202 Unspecified ovarian cyst, left side: Secondary | ICD-10-CM

## 2020-10-24 NOTE — Telephone Encounter (Signed)
ORDER LABS

## 2020-10-25 ENCOUNTER — Other Ambulatory Visit: Payer: BLUE CROSS/BLUE SHIELD

## 2020-10-27 ENCOUNTER — Ambulatory Visit: Payer: BLUE CROSS/BLUE SHIELD | Admitting: Obstetrics and Gynecology

## 2020-11-01 ENCOUNTER — Ambulatory Visit (HOSPITAL_COMMUNITY): Payer: BLUE CROSS/BLUE SHIELD

## 2020-11-03 ENCOUNTER — Ambulatory Visit
Admission: RE | Admit: 2020-11-03 | Discharge: 2020-11-03 | Disposition: A | Discharge status: Home / Self Care | Payer: BLUE CROSS/BLUE SHIELD | Source: Ambulatory Visit | Attending: Obstetrics and Gynecology | Admitting: Obstetrics and Gynecology

## 2020-11-03 DIAGNOSIS — N83202 Unspecified ovarian cyst, left side: Secondary | ICD-10-CM

## 2020-11-08 ENCOUNTER — Telehealth: Payer: Self-pay

## 2020-11-09 ENCOUNTER — Ambulatory Visit: Payer: BLUE CROSS/BLUE SHIELD | Admitting: Obstetrics and Gynecology

## 2020-11-09 NOTE — Telephone Encounter (Signed)
Entered in error

## 2020-11-17 ENCOUNTER — Telehealth: Payer: Self-pay | Admitting: Internal Medicine

## 2020-11-17 DIAGNOSIS — R109 Unspecified abdominal pain: Secondary | ICD-10-CM

## 2020-11-17 NOTE — Telephone Encounter (Signed)
Covering for [name] while out of the office. According to Leslie Rodriguez's last OV 08/2020, there was plans to obtain xray with next episode of abdominal pain, distension, cramping to rule out obstruction.   We can offer DG Abd 2 views if she wants or if she is feeling better, we can wait until next episode, and she should call at onset of symptoms.   If she is still feeling constipation on Linzess 290mg c daily, Benefiber, we can add Miralax 17 grams daily (OTC).

## 2020-11-17 NOTE — Telephone Encounter (Signed)
Pt needs to speak with nurse. [phone]

## 2020-11-17 NOTE — Telephone Encounter (Signed)
Lmom for pt to call office back. 

## 2020-11-17 NOTE — Telephone Encounter (Signed)
Spoke to pt. She informed me that she was having sever cramping since Sunday evening. Pain is a 7 out of 10. She informed me that she has been taking Tylenol for the pain. She is not sure if she had a fever or not. Stated she did have n/v on Sunday and Monday, but it has subsided. She has not been able to eat, just drinking Gatorade, ginger ale and water. Stated she has been constipated. Only going a little bit. Has been taking Linzess and Benefiber. Stated she is feeling better. Just wanted Leslie Rodriguez to know about symptoms.

## 2020-11-21 ENCOUNTER — Ambulatory Visit (HOSPITAL_COMMUNITY)
Admission: RE | Admit: 2020-11-21 | Discharge: 2020-11-21 | Disposition: A | Discharge status: Home / Self Care | Payer: BLUE CROSS/BLUE SHIELD | Source: Ambulatory Visit | Attending: Gastroenterology | Admitting: Gastroenterology

## 2020-11-21 ENCOUNTER — Other Ambulatory Visit: Payer: Self-pay

## 2020-11-21 DIAGNOSIS — R109 Unspecified abdominal pain: Secondary | ICD-10-CM | POA: Insufficient documentation

## 2020-11-21 NOTE — Telephone Encounter (Signed)
Order entered for DG abd 2 view.  Tried to call pt, left detailed message to inform her x-ray order was placed. She can go anytime for x-ray, no appt needed.

## 2020-11-21 NOTE — Addendum Note (Signed)
Addended by: Sandria Senter C on: 11/21/2020 10:59 AM   Modules accepted: Orders

## 2020-11-21 NOTE — Telephone Encounter (Signed)
Spoke to pt. She informed me that she is still having sever cramps. States she is only having small bowel movements. No bleeding, no n/v, no fever. She is still taking the Linzess , Benefiber and she states she is taking more Miralax. Still only going a little. Informed pt of DG Abd 2 scan and she agreed to have it.    Routing to [employer] and [facility] Clinical Pool

## 2020-12-02 ENCOUNTER — Ambulatory Visit: Payer: BLUE CROSS/BLUE SHIELD | Admitting: Obstetrics and Gynecology

## 2021-02-22 ENCOUNTER — Ambulatory Visit: Payer: BLUE CROSS/BLUE SHIELD | Admitting: Gastroenterology

## 2021-03-01 ENCOUNTER — Other Ambulatory Visit: Payer: Self-pay | Admitting: Cardiology

## 2021-03-01 ENCOUNTER — Ambulatory Visit: Payer: BLUE CROSS/BLUE SHIELD | Admitting: Gastroenterology

## 2021-03-01 DIAGNOSIS — I10 Essential (primary) hypertension: Secondary | ICD-10-CM

## 2021-05-17 ENCOUNTER — Telehealth: Payer: Self-pay | Admitting: *Deleted

## 2021-05-17 ENCOUNTER — Telehealth: Payer: Self-pay | Admitting: Internal Medicine

## 2021-05-17 NOTE — Telephone Encounter (Signed)
Received [employer] papers 05/17/21. Filled out and given to Darl Pikes to call pt and send papers to scan center.

## 2021-05-17 NOTE — Telephone Encounter (Signed)
PATIENT DROPPED OFF FMLA PAPERS TO BE COMPLETED  

## 2021-05-18 NOTE — Telephone Encounter (Signed)
Received FMLA papers and filled them out. Give to Manuela Schwartz to call pt and send to scan center

## 2021-06-05 ENCOUNTER — Telehealth: Payer: Self-pay

## 2021-06-05 NOTE — Telephone Encounter (Signed)
PA for Linzess Cap 290 mcg was approved from 06/05/21 through 06/05/2022. Approval letter to be scanned in to patient's chart.

## 2021-07-09 ENCOUNTER — Other Ambulatory Visit: Payer: Self-pay | Admitting: Gastroenterology

## 2021-07-10 NOTE — Telephone Encounter (Signed)
Last ov 08/31/20 ?

## 2021-07-11 ENCOUNTER — Encounter: Payer: Self-pay | Admitting: Internal Medicine

## 2021-07-11 ENCOUNTER — Other Ambulatory Visit: Payer: Self-pay

## 2021-07-11 ENCOUNTER — Encounter: Payer: Self-pay | Admitting: Gastroenterology

## 2021-07-11 ENCOUNTER — Ambulatory Visit (INDEPENDENT_AMBULATORY_CARE_PROVIDER_SITE_OTHER): Payer: 59 | Admitting: Gastroenterology

## 2021-07-11 VITALS — BP 118/72 | HR 87 | Temp 97.5°F | Ht 63.0 in | Wt 248.6 lb

## 2021-07-11 DIAGNOSIS — K5900 Constipation, unspecified: Secondary | ICD-10-CM | POA: Diagnosis not present

## 2021-07-11 MED ORDER — LUBIPROSTONE 24 MCG PO CAPS: 24.0000 ug | ORAL_CAPSULE | Freq: Two times a day (BID) | ORAL | 3 refills | Status: DC

## 2021-07-11 NOTE — Patient Instructions (Signed)
Let's try Amitiza instead of Linzess. Start Amitiza one gelcap twice a day WITH FOOD ( to avoid nausea). IF you do not like this, we can go back to Linzess! ? ?Have a great summer! I will see you in the fall! ? ?I enjoyed seeing you again today! As you know, I value our relationship and want to provide genuine, compassionate, and quality care. I welcome your feedback. If you receive a survey regarding your visit,  I greatly appreciate you taking time to fill this out. See you next time! ? ?Gelene Mink, PhD, ANP-BC ?[facility]  ? ?

## 2021-07-11 NOTE — Progress Notes (Signed)
? ? ? ? ?Gastroenterology Office Note   ? ? ?Primary Care Physician:  Leilani Able, MD  ?Primary Gastroenterologist: Dr. Jena Gauss ? ? ?Chief Complaint  ? ?Chief Complaint  ?Patient presents with  ? Follow-up  ?  Follow up visit ? ?Pt needs note for her FMLA claim  ? ? ? ?History of Present Illness  ? ?Leslie Rodriguez is a 56 y.o. female presenting today in follow-up with a history of GERD, constipation, internal hemorrhoids s/p banding in 2018. Colonoscopy Dec 2021: hemorrhoids, pancolonic diverticulosis, redundant colon. Felt to possibly have episode of ischemic colitis last year and most recently while on a cruise. Due to abdominal pain during visit in Feb 2022, CT was performed. This showed 4.4 cm complex cystic lesion in left ovary. Pelvic US then completed. Referred to GYN who followed. Repeat US in May without ability to visualize left ovary. Following serially.  ? ?Will have episodes of constipation for several days but will take miralax and stool softeners to help. Doesn't take Miralax daily. Happened end of January and then August. Linzess 290 mcg daily. For most part helps but still has issues.  ?Omeprazole daily for GERD.  ? ?Past Medical History:  ?Diagnosis Date  ? Anxiety   ? Depression   ? Hypertension   ? Obesity   ? ? ?Past Surgical History:  ?Procedure Laterality Date  ? ABDOMINAL HYSTERECTOMY  2003  ? CESAREAN SECTION  1985  ? COLONOSCOPY  01/16/2006  ? RMR: normal rectum/left sided transverse diverticula  ? COLONOSCOPY N/A 09/21/2015  ? Dr. Jena Gauss: internal hemorrhoids, diverticulosis   ? COLONOSCOPY WITH PROPOFOL N/A 03/31/2020  ? hemorrhoids, pancolonic diverticulosis, redundant colon.   ? ? ?Current Outpatient Medications  ?Medication Sig Dispense Refill  ? acetaminophen (TYLENOL) 500 MG tablet Take 1,000 mg by mouth every 8 (eight) hours as needed for moderate pain.    ? chlorthalidone (HYGROTON) 25 MG tablet TAKE 1/2 TABLET BY MOUTH EVERY MORNING 90 tablet 1  ? LINZESS 290 MCG CAPS capsule TAKE 1  CAPSULE(290 MCG) BY MOUTH DAILY BEFORE BREAKFAST 90 capsule 3  ? lubiprostone (AMITIZA) 24 MCG capsule Take 1 capsule (24 mcg total) by mouth 2 (two) times daily with a meal. 60 capsule 3  ? omeprazole (PRILOSEC) 20 MG capsule Take 1 capsule (20 mg total) by mouth daily before breakfast. 30 capsule 5  ? ?No current facility-administered medications for this visit.  ? ? ?Allergies as of 07/11/2021 - Review Complete 07/11/2021  ?Allergen Reaction Noted  ? Ciprofloxacin hcl Hives and Shortness Of Breath 07/27/2011  ? ? ?Family History  ?Problem Relation Age of Onset  ? Edema Mother   ? Colon cancer Neg Hx   ? ? ?Social History  ? ?Socioeconomic History  ? Marital status: Single  ?  Spouse name: Not on file  ? Number of children: 4  ? Years of education: Not on file  ? Highest education level: Not on file  ?Occupational History  ? Not on file  ?Tobacco Use  ? Smoking status: Never  ? Smokeless tobacco: Never  ?Vaping Use  ? Vaping Use: Never used  ?Substance and Sexual Activity  ? Alcohol use: Yes  ?  Comment: socially  ? Drug use: No  ? Sexual activity: Not Currently  ?Other Topics Concern  ? Not on file  ?Social History Narrative  ? Not on file  ? ?Social Determinants of Health  ? ?Financial Resource Strain: Not on file  ?Food Insecurity: Not on file  ?  Transportation Needs: Not on file  ?Physical Activity: Not on file  ?Stress: Not on file  ?Social Connections: Not on file  ?Intimate Partner Violence: Not on file  ? ? ? ?Review of Systems  ? ?Gen: Denies any fever, chills, fatigue, weight loss, lack of appetite.  ?CV: Denies chest pain, heart palpitations, peripheral edema, syncope.  ?Resp: Denies shortness of breath at rest or with exertion. Denies wheezing or cough.  ?GI: Denies dysphagia or odynophagia. Denies jaundice, hematemesis, fecal incontinence. ?GU : Denies urinary burning, urinary frequency, urinary hesitancy ?MS: Denies joint pain, muscle weakness, cramps, or limitation of movement.  ?Derm: Denies rash,  itching, dry skin ?Psych: Denies depression, anxiety, memory loss, and confusion ?Heme: Denies bruising, bleeding, and enlarged lymph nodes. ? ? ?Physical Exam  ? ?BP 118/72   Pulse 87   Temp (!) 97.5 ?F (36.4 ?C)   Ht 5\' 3"  (1.6 m)   Wt 248 lb 9.6 oz (112.8 kg)   BMI 44.04 kg/m?  ?General:   Alert and oriented. Pleasant and cooperative. Well-nourished and well-developed.  ?Head:  Normocephalic and atraumatic. ?Eyes:  Without icterus ?Abdomen:  +BS, soft, non-tender and non-distended. No HSM noted. No guarding or rebound. No masses appreciated.  ?Rectal:  Deferred  ?Msk:  Symmetrical without gross deformities. Normal posture. ?Extremities:  Without edema. ?Neurologic:  Alert and  oriented x4;  grossly normal neurologically. ?Skin:  Intact without significant lesions or rashes. ?Psych:  Alert and cooperative. Normal mood and affect. ? ? ?Assessment  ? ?Leslie Rodriguez is a 56 y.o. female presenting today in follow-up with a history of  GERD, constipation, and internal hemorrhoids s/p banding in 2018, presenting today in follow-up. ? ?Constipation: not ideally managed on Linzess 290 mcg daily. Will change to Amitiza 24 mcg po BID with food. Prescription sent to pharmacy.  ? ?GERD: doing well on once daily PPI. No alarm signs/symptoms.  ? ?PLAN  ? ? ?Amitiza 24 mcg po BID ?Omeprazole daily ?Return in 6 months ? ? ?2019, PhD, ANP-BC ?[facility]  ? ? ?

## 2021-07-17 ENCOUNTER — Telehealth: Payer: Self-pay

## 2021-07-17 NOTE — Telephone Encounter (Signed)
PA done on the pt for Amitiza 24 mcg. (Pt stated Linzess 290 mcg doesn't work. Pt has IBS-C. Her notes were sent, waiting on a response from Cover My Meds. ?

## 2021-07-17 NOTE — Telephone Encounter (Signed)
Pt has been approved for Amitiza 24 mcg for 07/17/2021 through 07/18/2022. Pharmacy has been notified and documentation has been sent to Darl Pikes to scan in pt's chart. ?

## 2021-07-19 NOTE — Telephone Encounter (Signed)
Returned the pt's call and LMOVM regarding her Linzess and Amitiza ?

## 2021-07-26 ENCOUNTER — Ambulatory Visit: Payer: 59 | Admitting: Cardiology

## 2021-07-26 ENCOUNTER — Encounter: Payer: Self-pay | Admitting: Cardiology

## 2021-07-26 VITALS — BP 125/86 | HR 81 | Temp 98.0°F | Resp 16 | Ht 63.0 in | Wt 251.4 lb

## 2021-07-26 DIAGNOSIS — I10 Essential (primary) hypertension: Secondary | ICD-10-CM

## 2021-07-26 NOTE — Progress Notes (Signed)
?ID:  Leslie Rodriguez, DOB 10-28-1965, MRN [id number] ? ?PCP:  Lin Landsman, MD  ?Cardiologist:  Rex Kras, DO, [facility] (established care 11/25/2019) ? ?Date: 07/26/21 ?Last Office Visit: 07/21/2020. ? ?Chief Complaint  ?Patient presents with  ? Hypertension  ? Follow-up  ? ? ?HPI  ?Leslie Rodriguez is a 56 y.o. female whose past medical history and cardiovascular risk factors include: Benign essential hypertension, obesity due to excess calories. ? ?Patient presents today for her annual follow-up visit. She established care with the practice back in August 2021 when she was referred for blood pressure management and lower extremity swelling. ? ?Since up titration of antihypertensive medications her blood pressure has improved and now normal range.  Her lower extremity swelling has resolved.  Over the last 1 year patient denies any cardiovascular symptoms of chest pain or heart failure.  No recent hospitalizations or urgent care visits for cardiovascular symptoms. ? ?FUNCTIONAL STATUS: ?No structured exercise program or daily routine.  ? ?ALLERGIES: ?Allergies  ?Allergen Reactions  ? Ciprofloxacin Hcl Hives and Shortness Of Breath  ? ? ?MEDICATION LIST PRIOR TO VISIT: ?Current Meds  ?Medication Sig  ? acetaminophen (TYLENOL) 500 MG tablet Take 1,000 mg by mouth every 8 (eight) hours as needed for moderate pain.  ? busPIRone (BUSPAR) 7.5 MG tablet Take 7.5 mg by mouth 2 (two) times daily.  ? chlorthalidone (HYGROTON) 25 MG tablet TAKE 1/2 TABLET BY MOUTH EVERY MORNING  ? omeprazole (PRILOSEC) 20 MG capsule Take 1 capsule (20 mg total) by mouth daily before breakfast.  ?  ? ?PAST MEDICAL HISTORY: ?Past Medical History:  ?Diagnosis Date  ? Anxiety   ? Depression   ? Hypertension   ? Obesity   ? ? ?PAST SURGICAL HISTORY: ?Past Surgical History:  ?Procedure Laterality Date  ? ABDOMINAL HYSTERECTOMY  2003  ? [place]  ? COLONOSCOPY  01/16/2006  ? RMR: normal rectum/left sided transverse diverticula  ? COLONOSCOPY  N/A 09/21/2015  ? Dr. Gala Romney: internal hemorrhoids, diverticulosis   ? COLONOSCOPY WITH PROPOFOL N/A 03/31/2020  ? hemorrhoids, pancolonic diverticulosis, redundant colon.   ? ? ?FAMILY HISTORY: ?The patient family history includes Edema in her mother. ? ?SOCIAL HISTORY:  ?The patient  reports that she has never smoked. She has never used smokeless tobacco. She reports current alcohol use. She reports that she does not use drugs. ? ?REVIEW OF SYSTEMS: ?Review of Systems  ?Cardiovascular:  Negative for chest pain, dyspnea on exertion, leg swelling, palpitations and syncope.  ? ?PHYSICAL EXAM: ? ?  07/26/2021  ? 10:17 AM 07/11/2021  ?  2:14 PM 08/31/2020  ? 10:21 AM  ?Vitals with BMI  ?Height 5' 3"  5' 3"  5' 3"   ?Weight 251 lbs 6 oz 248 lbs 10 oz 254 lbs 6 oz  ?BMI 44.54 44.05 45.08  ?Systolic 564 332 951  ?Diastolic 86 72 83  ?Pulse 81 87 56  ? ? ?PHYSICAL EXAM: ? ?  07/26/2021  ? 10:17 AM 07/11/2021  ?  2:14 PM 08/31/2020  ? 10:21 AM  ?Vitals with BMI  ?Height 5' 3"  5' 3"  5' 3"   ?Weight 251 lbs 6 oz 248 lbs 10 oz 254 lbs 6 oz  ?BMI 44.54 44.05 45.08  ?Systolic 884 166 063  ?Diastolic 86 72 83  ?Pulse 81 87 56  ? ? ?CONSTITUTIONAL: Well-developed and well-nourished. No acute distress.  ?SKIN: Skin is warm and dry. No rash noted. No cyanosis. No pallor. No jaundice ?HEAD: Normocephalic and atraumatic.  ?EYES:  No scleral icterus ?MOUTH/THROAT: Moist oral membranes.  ?NECK: No JVD present. No thyromegaly noted. No carotid bruits  ?LYMPHATIC: No visible cervical adenopathy.  ?CHEST Normal respiratory effort. No intercostal retractions  ?LUNGS: Clear to auscultation bilaterally.  No stridor. No wheezes. No rales.  ?CARDIOVASCULAR: Regular rate and rhythm, positive S1-S2, no murmurs rubs or gallops appreciated. ?ABDOMINAL: Soft, nontender, nondistended, positive bowel sounds in all 4 quadrants, no apparent ascites.  ?EXTREMITIES: No peripheral edema, warm to touch, 2+ DP and PT pulses. ?HEMATOLOGIC: No significant  bruising ?NEUROLOGIC: Oriented to person, place, and time. Nonfocal. Normal muscle tone.  ?PSYCHIATRIC: Normal mood and affect. Normal behavior. Cooperative ? ? ?CARDIAC DATABASE: ?EKG:  ?July 26, 2021: Normal sinus rhythm, 79 bpm, IRBBB, LAE, nonspecific T wave abnormality.  ? ?Echocardiogram: ?12/09/2019:  ?Left ventricle cavity is normal in size. Moderate concentric hypertrophy of the left ventricle. Normal global wall motion. Normal LV systolic function with EF 61%. Doppler evidence of grade I (impaired) diastolic dysfunction, normal LAP. Moderate tricuspid regurgitation. Estimated pulmonary artery systolic pressure 36 mmHg.  ? ?Stress Testing: ?None ? ?Heart Catheterization: ?None ? ?Lower Extremity Venous Duplex  12/09/2019:  ?No evidence of deep vein thrombosis of the lower extremities with normal venous return.  ? ?LABORATORY DATA: ?External Labs: ?Collected: 02/19/2018 ?Creatinine 1.13 mg/dL. ?eGFR: 65 mL/min per 1.73 m? ? ? ?  Latest Ref Rng & Units 06/06/2020  ? 11:17 AM 10/22/2019  ? 12:17 PM 05/30/2017  ?  6:00 AM  ?CBC  ?WBC 3.8 - 10.8 Thousand/uL 6.5   5.8   5.4    ?Hemoglobin 11.7 - 15.5 g/dL 12.8   12.7   12.8    ?Hematocrit 35.0 - 45.0 % 38.6   38.1   39.5    ?Platelets 140 - 400 Thousand/uL 348   340   320    ? ? ? ?  Latest Ref Rng & Units 06/07/2020  ?  4:16 PM 12/02/2019  ?  1:40 PM 05/30/2017  ?  6:00 AM  ?CMP  ?Glucose 65 - 99 mg/dL  104   123    ?BUN 6 - 24 mg/dL  11   13    ?Creatinine 0.44 - 1.00 mg/dL 1.00   0.97   1.13    ?Sodium 134 - 144 mmol/L  141   143    ?Potassium 3.5 - 5.2 mmol/L  4.3   3.9    ?Chloride 96 - 106 mmol/L  101   102    ?CO2 20 - 29 mmol/L  28   30    ?Calcium 8.7 - 10.2 mg/dL  9.5   9.4    ? ?IMPRESSION: ? ?  ICD-10-CM   ?1. Essential hypertension  I10 EKG 12-Lead  ?  ?2. Class 3 severe obesity due to excess calories without serious comorbidity with body mass index (BMI) of 40.0 to 44.9 in adult [facility])  E66.01   ? Z68.41   ?  ?  ? ?RECOMMENDATIONS: ?Leslie Rodriguez is a 56  y.o. female whose past medical history and cardiac risk factors include: Hypertension and obesity. ? ?Essential hypertension ?Currently on chlorthalidone. ?Office blood pressures are very well controlled. ?Patient is educated on the importance of a low-salt diet. ?Patient is also educated on the importance of losing weight which will also improve her blood pressure readings and may be able to wean off antihypertensive medications if and when possible. ? ?Class 3 severe obesity due to excess calories without serious comorbidity with body mass  index (BMI) of 40.0 to 44.9 in adult [facility]) ?Body mass index is 44.53 kg/m?. ?I reviewed with the patient the importance of diet, regular physical activity/exercise, weight loss.   ?Patient is educated on increasing physical activity gradually as tolerated.  With the goal of moderate intensity exercise for 30 minutes a day 5 days a week. ? ?Patient has remained stable over the last 1 year.  Her blood pressures and remained relatively stable on current medical therapy.  I have requested either following up on as-needed basis or annually.  Patient states that she would feel more comfortable following up annually.  I recommended that she schedule her 1 year follow-up visit after her yearly physical so that we can review the labs and answer any questions or concerns that she may have.  She is agreeable with the plan of care. ? ?Total time spent: 20 minutes. ? ? ?FINAL MEDICATION LIST END OF ENCOUNTER: ?No orders of the defined types were placed in this encounter. ?  ?Medications Discontinued During This Encounter  ?Medication Reason  ? LINZESS 290 MCG CAPS capsule Change in therapy  ? lubiprostone (AMITIZA) 24 MCG capsule   ?  ? ?Current Outpatient Medications:  ?  acetaminophen (TYLENOL) 500 MG tablet, Take 1,000 mg by mouth every 8 (eight) hours as needed for moderate pain., Disp: , Rfl:  ?  busPIRone (BUSPAR) 7.5 MG tablet, Take 7.5 mg by mouth 2 (two) times daily., Disp: , Rfl:   ?  chlorthalidone (HYGROTON) 25 MG tablet, TAKE 1/2 TABLET BY MOUTH EVERY MORNING, Disp: 90 tablet, Rfl: 1 ?  omeprazole (PRILOSEC) 20 MG capsule, Take 1 capsule (20 mg total) by mouth daily before breakfast., Disp: 30

## 2021-08-21 ENCOUNTER — Telehealth: Payer: Self-pay

## 2021-08-21 MED ORDER — LINACLOTIDE 290 MCG PO CAPS: 290.0000 ug | ORAL_CAPSULE | Freq: Every day | ORAL | 3 refills | Status: DC

## 2021-08-21 NOTE — Telephone Encounter (Signed)
I have sent Linzess 290 mcg daily to her pharmacy.  ?

## 2021-08-21 NOTE — Addendum Note (Signed)
Addended by: Annitta Needs on: 08/21/2021 04:55 PM ? ? Modules accepted: Orders ? ?

## 2021-08-21 NOTE — Telephone Encounter (Signed)
Pt called and LMOVM regarding the Amitiza not working. She had some left over Linzess 290 mcg at home and she took it over the weekend and it helped her and she now wants that Rx phoned in to her pharmacy. The note I put in on April 3rd states that Linzess did not work for her. Please advise. ?

## 2021-08-22 NOTE — Telephone Encounter (Signed)
Phoned and LMOVM regarding pt's Rx being sent in. ?

## 2021-09-14 NOTE — Telephone Encounter (Signed)
Pt LMOVM asking if you would change her Linzess Rx to every 30 days instead of 90 day supply. Please advise

## 2021-09-15 MED ORDER — LINACLOTIDE 290 MCG PO CAPS: 290.0000 ug | ORAL_CAPSULE | Freq: Every day | ORAL | 3 refills | Status: DC

## 2021-09-15 NOTE — Telephone Encounter (Signed)
Done. Changed to 30 day supply.

## 2021-09-15 NOTE — Addendum Note (Signed)
Addended by: Gelene Mink on: 09/15/2021 12:23 PM   Modules accepted: Orders

## 2021-09-18 NOTE — Telephone Encounter (Signed)
Pt aware of Rx being changed

## 2021-10-10 ENCOUNTER — Ambulatory Visit
Admission: RE | Admit: 2021-10-10 | Discharge: 2021-10-10 | Disposition: A | Discharge status: Home / Self Care | Payer: 59 | Source: Ambulatory Visit | Attending: Family Medicine | Admitting: Family Medicine

## 2021-10-10 ENCOUNTER — Other Ambulatory Visit: Payer: Self-pay | Admitting: Family Medicine

## 2021-10-10 DIAGNOSIS — L989 Disorder of the skin and subcutaneous tissue, unspecified: Secondary | ICD-10-CM

## 2021-11-10 ENCOUNTER — Other Ambulatory Visit: Payer: Self-pay | Admitting: Family Medicine

## 2021-11-10 DIAGNOSIS — R229 Localized swelling, mass and lump, unspecified: Secondary | ICD-10-CM

## 2021-11-10 DIAGNOSIS — R591 Generalized enlarged lymph nodes: Secondary | ICD-10-CM

## 2021-11-14 ENCOUNTER — Ambulatory Visit
Admission: RE | Admit: 2021-11-14 | Discharge: 2021-11-14 | Disposition: A | Discharge status: Home / Self Care | Payer: 59 | Source: Ambulatory Visit | Attending: Family Medicine | Admitting: Family Medicine

## 2021-11-14 DIAGNOSIS — R591 Generalized enlarged lymph nodes: Secondary | ICD-10-CM

## 2021-11-14 DIAGNOSIS — R229 Localized swelling, mass and lump, unspecified: Secondary | ICD-10-CM

## 2021-11-24 ENCOUNTER — Other Ambulatory Visit: Payer: Self-pay | Admitting: Family Medicine

## 2021-11-24 DIAGNOSIS — R222 Localized swelling, mass and lump, trunk: Secondary | ICD-10-CM

## 2021-12-22 ENCOUNTER — Other Ambulatory Visit: Payer: 59

## 2021-12-25 ENCOUNTER — Inpatient Hospital Stay: Admission: RE | Admit: 2021-12-25 | Payer: 59 | Source: Ambulatory Visit

## 2022-01-01 ENCOUNTER — Encounter: Payer: Self-pay | Admitting: *Deleted

## 2022-01-18 MED ORDER — ONDANSETRON HCL 4 MG PO TABS: 4.0000 mg | ORAL_TABLET | Freq: Three times a day (TID) | ORAL | 1 refills | Status: DC | PRN

## 2022-01-18 NOTE — Progress Notes (Signed)
Primary Care Physician:  Lin Landsman, MD  Primary Gastroenterologist: Cristopher Estimable. Rourk, MD  Patient Location: Home Reason for Visit: nausea and rectal bleeding  Persons present on the virtual encounter, with roles: Patient - Leslie Rodriguez;  Provider - Venetia Night, NP   Total time (minutes) spent on medical discussion: 18 minutes  Virtual Visit Encounter Note Visit is conducted virtually and was requested by patient.   I connected with Leslie Rodriguez on 01/19/22 at  8:00 AM EDT by video and verified that I am speaking with the correct person using two identifiers.   I discussed the limitations, risks, security and privacy concerns of performing an evaluation and management service by video and the availability of in person appointments. I also discussed with the patient that there may be a patient responsible charge related to this service. The patient expressed understanding and agreed to proceed.  Chief Complaint  Patient presents with   Nausea    Rectal bleeding happened twice. Normal BM's. Nausea is better.      History of Present Illness: Leslie Rodriguez is a 56 y.o. female with a history of GERD, constipation, internal hemorrhoids s/p banding in 2018. Possible history of ischemic colitis  in 2022.  Colonoscopy December 2021 with hemorrhoids, pancolonic diverticulosis, and redundant colon  CT in February 2022 with 4.4 cm cystic lesion in left ovary. Pelvic US completed and she was referred to GYN. Repeat US May 2022 with inability to visualize left ovary.  Last office visit in March 2023 noting episodes of constipation for several days and will take mirlaac and stool softeners to help. Was taking Linzess 290 mcg daily and helped mostly. Taking omeprazole for GERD. Switched from [place] to Amitiza 24 mcg BID.   Per patient she did not have good relief with Amitiza and had taken leftover Linzess and had good relief with this.   Patient sent in myChart message reporting  nausea, constipation, bloating, and rectal bleeding intermittently. She sent in picture of stool for reference. It was stool with light amounts of pink in toilet water. She reported bloating and passing a lot of gas and having abdominal cramping. On Monday this week she began having some looser stools and then became nauseated. She did not eat much on Monday or Tuesday per patient.   Today: Usually bright red on the toilet tissue with little bitty clots. Reports it happened twice on Monday. Had a lot of pain before and some pain after. Feels similar to when she had ischemic colitis. Pain has gotten better today. Does not need to take strong medication now. Nausea started Sunday evening and then she became bloated. As not had a bowel movement sine Monday. Has Linzess and miralax. It lasts for about a week or so and then things get better. Pain is all over. Reports last time this happened she was on a cruise. Had some lightheadedness on Wednesday and none. No syncope. Has been able to tolerate diet well.   Zofran has been working well. Just had dry heaves. Has not eaten much until yesterday either. Wednesdays evening she ate some jello and instant mashed potatoes and eggs.  Has been able to stay hydrated.  Denies any recent fevers, or chills.  Unable to pinpoint any food triggers for her nausea.  She initially thought the nausea may be some motion sickness coming back from a wedding.    Medications Current Meds  Medication Sig   acetaminophen (TYLENOL) 500 MG tablet Take 1,000  mg by mouth every 8 (eight) hours as needed for moderate pain.   busPIRone (BUSPAR) 7.5 MG tablet Take 7.5 mg by mouth 2 (two) times daily.   chlorthalidone (HYGROTON) 25 MG tablet TAKE 1/2 TABLET BY MOUTH EVERY MORNING   CONTRAVE 8-90 MG TB12 Take by mouth.   linaclotide (LINZESS) 290 MCG CAPS capsule Take 1 capsule (290 mcg total) by mouth daily before breakfast.   omeprazole (PRILOSEC) 20 MG capsule Take 1 capsule (20 mg  total) by mouth daily before breakfast.   ondansetron (ZOFRAN) 4 MG tablet Take 1 tablet (4 mg total) by mouth every 8 (eight) hours as needed for nausea or vomiting.     History Past Medical History:  Diagnosis Date   Anxiety    Depression    Hypertension    Obesity     Past Surgical History:  Procedure Laterality Date   ABDOMINAL HYSTERECTOMY  2003   CESAREAN SECTION  1985   COLONOSCOPY  01/16/2006   RMR: normal rectum/left sided transverse diverticula   COLONOSCOPY N/A 09/21/2015   Dr. Jena Gauss: internal hemorrhoids, diverticulosis    COLONOSCOPY WITH PROPOFOL N/A 03/31/2020   hemorrhoids, pancolonic diverticulosis, redundant colon.     Family History  Problem Relation Age of Onset   Edema Mother    Colon cancer Neg Hx     Social History   Socioeconomic History   Marital status: Single    Spouse name: Not on file   Number of children: 4   Years of education: Not on file   Highest education level: Not on file  Occupational History   Not on file  Tobacco Use   Smoking status: Never   Smokeless tobacco: Never  Vaping Use   Vaping Use: Never used  Substance and Sexual Activity   Alcohol use: Yes    Comment: socially   Drug use: No   Sexual activity: Not Currently  Other Topics Concern   Not on file  Social History Narrative   Not on file   Social Determinants of Health   Financial Resource Strain: Not on file  Food Insecurity: Not on file  Transportation Needs: Not on file  Physical Activity: Not on file  Stress: Not on file  Social Connections: Not on file      Review of Systems: Gen: Denies fever, chills, anorexia. Denies fatigue, weakness, weight loss.  CV: Denies chest pain, palpitations, syncope, peripheral edema, and claudication. Resp: Denies dyspnea at rest, cough, wheezing, coughing up blood, and pleurisy. GI: see HPI Derm: Denies rash, itching, dry skin Psych: Denies depression, anxiety, memory loss, confusion. No homicidal or suicidal  ideation.  Heme: Denies bruising, bleeding, and enlarged lymph nodes.  Observations/Objective: No distress. Alert and oriented. Pleasant. Well nourished. Normal mood and affect. Unable to perform complete physical exam due to video encounter.   Assessment:  GERD: Controlled on omeprazole 20 mg daily.   Constipation: Currently on Linzess 290 mcg daily and MiraLAX daily.  Prior to her recent episode of rectal bleeding with some looser stools, she was having good regular bowel movements on Linzess.  She has not had a bowel movement since Monday when she had some rectal bleeding.  I have encouraged her to eat a bland/soft diet for the next week and not her nausea is under control she should have more intake.  I have asked her to start taking MiraLAX twice daily and to follow-up if she has not had a bowel movement in a couple of days.  Rectal  bleeding/Abdominal pain: Began having abdominal pain on Monday with urgent need to defecate.  Stools more on the looser side and had 2 episodes of rectal bleeding.  Pain has continued to linger since then.  Has also had some associated nausea as stated below.  Pain has been slowly improving, and she has been able to eat a soft diet since Wednesday, but Monday and Tuesday she did not have much of an appetite, was only staying hydrated.  Denies any recent fevers or chills.  No specific food triggers.  No recent sick contacts.  Patient noted a similar episode in February of last year where she went on a cruise, [place] ischemic colitis.  She had CT at that time with no acute abnormality and when she presented for her office visit she was feeling better.  She noted 1 episode of lightheadedness on Wednesday but has been feeling well since.  Likely sending for abdominal imaging at this time would be low yield given its been 5 days since her symptoms began.  She has a history of hemorrhoids, these could likely be contributing to her rectal bleeding but would not explain her  abdominal pain or nausea.  Difficult to assess given that this is a video visit not able to perform a physical exam or any rectal exam.  Query a potential viral gastroenteritis or possible self-limiting foodborne illness, however given her history, and ischemic colitis is more likely.  We will treat empirically for colitis with brief course of Flagyl given that patient has an allergy to Cipro.  She should continue to monitor for any further rectal bleeding.  We will have her follow-up in couple of months and if ongoing symptoms, may need repeat colonoscopy or further imaging.  Nausea: Nausea began on Sunday evening on her way back from a laying.  Initially thought maybe this was some motion sickness.  Nausea continued and that she began having the abdominal pain and rectal bleeding that evening.  Patient pain is continued, however Wednesday nausea became more severe.  Zofran 4 mg as needed was called to her pharmacy.  She states is been very helpful for her.  She has been able to eat, eating a soft diet since Wednesday evening and staying well-hydrated.   Plan:  Miralax twice a day for the next 2 days until she has a bowel movement, then may resume daily dosing. Continue omeprazole 20 mg daily Bland diet and soft foods for the next week.  Continue Linzess 290 mcg daily.  Continue Zofran as needed Metronidazole 500mg  twice daily for 5 days Follow-up with in a couple days to let us know if you have not had a bowel movement Advised to contact the office if pain or nausea worsens or she has not had a bowel movement. Follow-up with [place] in 2 months    Follow Up Instructions:  I discussed the assessment and treatment plan with the patient. The patient was provided an opportunity to ask questions and all were answered. The patient agreed with the plan and demonstrated an understanding of the instructions.   The patient was advised to call back or seek an in-person evaluation if the symptoms  worsen or if the condition fails to improve as anticipated.    Lewie Loron, MSN, APRN, FNP-BC, AGACNP-BC [facility]

## 2022-01-19 ENCOUNTER — Encounter: Payer: Self-pay | Admitting: Gastroenterology

## 2022-01-19 ENCOUNTER — Telehealth (INDEPENDENT_AMBULATORY_CARE_PROVIDER_SITE_OTHER): Payer: 59 | Admitting: Gastroenterology

## 2022-01-19 ENCOUNTER — Telehealth: Payer: Self-pay | Admitting: Gastroenterology

## 2022-01-19 VITALS — Ht 63.0 in | Wt 250.0 lb

## 2022-01-19 DIAGNOSIS — K5900 Constipation, unspecified: Secondary | ICD-10-CM

## 2022-01-19 DIAGNOSIS — K625 Hemorrhage of anus and rectum: Secondary | ICD-10-CM | POA: Diagnosis not present

## 2022-01-19 DIAGNOSIS — R109 Unspecified abdominal pain: Secondary | ICD-10-CM | POA: Diagnosis not present

## 2022-01-19 DIAGNOSIS — K219 Gastro-esophageal reflux disease without esophagitis: Secondary | ICD-10-CM

## 2022-01-19 MED ORDER — METRONIDAZOLE 500 MG PO TABS: 500.0000 mg | ORAL_TABLET | Freq: Two times a day (BID) | ORAL | 0 refills | Status: AC

## 2022-01-19 NOTE — Telephone Encounter (Signed)
Patient seen for virtual visit today regarding abdominal pain, rectal bleeding, nausea.  She needs follow-up in 2 months, with AB.  Venetia Night, MSN, APRN, FNP-BC, AGACNP-BC [facility]

## 2022-01-19 NOTE — Patient Instructions (Addendum)
Continue to take your Linzess 290 mcg daily and your MiraLAX daily.  Over the next couple of days in which you to start taking MiraLAX twice daily to get your bowel movements back on track.  Your hemorrhoids could be somewhat contributing to your rectal bleeding.  Slowly increase your diet, keeping with a bland/soft diet for the next 5 to 7 days.  Continue to monitor for any further rectal bleeding.  If your abdominal pain worsens or you have ongoing worsening rectal bleeding, you should proceed to the ED.  Given your history of ischemic colitis and some ongoing lingering abdominal pain with nausea, I will treat you empirically for colitis with a short course of Flagyl alone given that you are allergic to Cipro.  It is important while taking the Flagyl but you not drink any alcohol.  Continue to use your Zofran as needed.  Please message with an update for the next few days as you are on antibiotics.  I will have you follow-up in the office in 2 months, or sooner if needed.  It was a pleasure to see you today. I want to create trusting relationships with patients. If you receive a survey regarding your visit,  I greatly appreciate you taking time to fill this out on paper or through your MyChart. I value your feedback.  Venetia Night, MSN, FNP-BC, AGACNP-BC [facility]

## 2022-01-25 NOTE — Telephone Encounter (Signed)
OV made °

## 2022-02-03 ENCOUNTER — Other Ambulatory Visit: Payer: Self-pay | Admitting: Cardiology

## 2022-02-03 DIAGNOSIS — I10 Essential (primary) hypertension: Secondary | ICD-10-CM

## 2022-02-05 ENCOUNTER — Other Ambulatory Visit: Payer: Self-pay | Admitting: Cardiology

## 2022-02-05 DIAGNOSIS — I10 Essential (primary) hypertension: Secondary | ICD-10-CM

## 2022-04-03 ENCOUNTER — Ambulatory Visit: Payer: 59 | Admitting: Gastroenterology

## 2022-05-23 ENCOUNTER — Ambulatory Visit: Payer: 59 | Admitting: Gastroenterology

## 2022-07-27 ENCOUNTER — Encounter: Payer: Self-pay | Admitting: Cardiology

## 2022-07-27 ENCOUNTER — Other Ambulatory Visit: Payer: Self-pay | Admitting: Cardiology

## 2022-07-27 ENCOUNTER — Ambulatory Visit: Payer: 59 | Admitting: Cardiology

## 2022-07-27 VITALS — BP 149/89 | HR 78 | Ht 63.0 in | Wt 251.0 lb

## 2022-07-27 DIAGNOSIS — I10 Essential (primary) hypertension: Secondary | ICD-10-CM

## 2022-07-27 MED ORDER — LOSARTAN POTASSIUM 50 MG PO TABS: 50.0000 mg | ORAL_TABLET | Freq: Every day | ORAL | 0 refills | Status: DC

## 2022-07-27 NOTE — Progress Notes (Signed)
ID:  Leslie Rodriguez, DOB [birth year], MRN [id number]  PCP:  Leilani Able, MD  Cardiologist:  Tessa Lerner, DO, [facility] (established care 11/25/2019)  Date: 07/27/22 Last Office Visit: July 26, 2021  Chief Complaint  Patient presents with   Essential hypertension   Follow-up    HPI  Leslie THUL is a 57 y.o. female whose past medical history and cardiovascular risk factors include: Benign essential hypertension, obesity due to excess calories.  Patient was referred to the practice back in August 2021 for management of benign essential hypertension and lower extremity swelling.  Patient's medications have been uptitrated and her blood pressures have been well-controlled and now presents for 1 year follow-up visit.  Over the last 1 year patient is doing well from a cardiovascular standpoint.  She denies anginal chest pain or heart failure symptoms.  She has been in stressful situations which may be contributory but her blood pressures at home consistently have been more than 140 mmHg.  No recent labs available for review.  She is due for her yearly well visit with PCP as well.  FUNCTIONAL STATUS: No structured exercise program or daily routine.   ALLERGIES: Allergies  Allergen Reactions   Ciprofloxacin Hcl Hives and Shortness Of Breath    MEDICATION LIST PRIOR TO VISIT: Current Meds  Medication Sig   acetaminophen (TYLENOL) 500 MG tablet Take 1,000 mg by mouth every 8 (eight) hours as needed for moderate pain.   chlorthalidone (HYGROTON) 25 MG tablet TAKE 1/2 TABLET BY MOUTH EVERY AM   omeprazole (PRILOSEC) 20 MG capsule Take 1 capsule (20 mg total) by mouth daily before breakfast.   ondansetron (ZOFRAN) 4 MG tablet Take 1 tablet (4 mg total) by mouth every 8 (eight) hours as needed for nausea or vomiting.   losartan (COZAAR) 50 MG tablet Take 1 tablet (50 mg total) by mouth daily at 10 pm.     PAST MEDICAL HISTORY: Past Medical History:  Diagnosis Date   Anxiety     Depression    Hypertension    Obesity     PAST SURGICAL HISTORY: Past Surgical History:  Procedure Laterality Date   ABDOMINAL HYSTERECTOMY  2003   CESAREAN SECTION  1985   COLONOSCOPY  01/16/2006   RMR: normal rectum/left sided transverse diverticula   COLONOSCOPY N/A 09/21/2015   Dr. Jena Gauss: internal hemorrhoids, diverticulosis    COLONOSCOPY WITH PROPOFOL N/A 03/31/2020   hemorrhoids, pancolonic diverticulosis, redundant colon.     FAMILY HISTORY: The patient family history includes Edema in her mother.  SOCIAL HISTORY:  The patient  reports that she has never smoked. She has never used smokeless tobacco. She reports current alcohol use. She reports that she does not use drugs.  REVIEW OF SYSTEMS: Review of Systems  Cardiovascular:  Negative for chest pain, claudication, dyspnea on exertion, irregular heartbeat, leg swelling, near-syncope, orthopnea, palpitations, paroxysmal nocturnal dyspnea and syncope.  Respiratory:  Negative for shortness of breath.   Hematologic/Lymphatic: Negative for bleeding problem.  Musculoskeletal:  Negative for muscle cramps and myalgias.  Neurological:  Negative for dizziness and light-headedness.    PHYSICAL EXAM:    07/27/2022   10:05 AM 01/19/2022    7:57 AM 07/26/2021   10:17 AM  Vitals with BMI  Height     Weight 251 lbs 250 lbs 251 lbs 6 oz  BMI 44.47 44.3 44.54  Systolic 149  125  Diastolic 89  86  Pulse 78  81   Physical Exam  Constitutional:  No distress.  Age appropriate, hemodynamically stable.   Neck: No JVD present.  Cardiovascular: Normal rate, regular rhythm, S1 normal, S2 normal, intact distal pulses and normal pulses. Exam reveals no gallop, no S3 and no S4.  No murmur heard. Pulmonary/Chest: Effort normal and breath sounds normal. No stridor. She has no wheezes. She has no rales.  Abdominal: Soft. Bowel sounds are normal. She exhibits no distension. There is no abdominal tenderness.  Musculoskeletal:         General: No edema.     Cervical back: Neck supple.  Neurological: She is alert and oriented to person, place, and time. She has intact cranial nerves (2-12).  Skin: Skin is warm and moist.   CARDIAC DATABASE: EKG:  July 27, 2022: Sinus rhythm, 66 bpm,, nonspecific T wave abnormality.  Echocardiogram: 12/09/2019:  Left ventricle cavity is normal in size. Moderate concentric hypertrophy of the left ventricle. Normal global wall motion. Normal LV systolic function with EF 61%. Doppler evidence of grade I (impaired) diastolic dysfunction, normal LAP. Moderate tricuspid regurgitation. Estimated pulmonary artery systolic pressure 36 mmHg.   Stress Testing: None  Heart Catheterization: None  Lower Extremity Venous Duplex  12/09/2019:  No evidence of deep vein thrombosis of the lower extremities with normal venous return.   LABORATORY DATA: External Labs: Collected: 02/19/2018 Creatinine 1.13 mg/dL. eGFR: 65 mL/min per 1.73 m     Latest Ref Rng & Units 06/06/2020   11:17 AM 10/22/2019   12:17 PM 05/30/2017    6:00 AM  CBC  WBC 3.8 - 10.8 Thousand/uL 6.5  5.8  5.4   Hemoglobin 11.7 - 15.5 g/dL 78.2  95.6  21.3   Hematocrit 35.0 - 45.0 % 38.6  38.1  39.5   Platelets 140 - 400 Thousand/uL 348  340  320        Latest Ref Rng & Units 06/07/2020    4:16 PM 12/02/2019    1:40 PM 05/30/2017    6:00 AM  CMP  Glucose 65 - 99 mg/dL  086  578   BUN 6 - 24 mg/dL  11  13   Creatinine 4.69 - 1.00 mg/dL 6.29  5.28  4.13   Sodium 134 - 144 mmol/L  141  143   Potassium 3.5 - 5.2 mmol/L  4.3  3.9   Chloride 96 - 106 mmol/L  101  102   CO2 20 - 29 mmol/L  28  30   Calcium 8.7 - 10.2 mg/dL  9.5  9.4    IMPRESSION:    ICD-10-CM   1. Essential hypertension  I10 EKG 12-Lead    Basic metabolic panel    Magnesium    losartan (COZAAR) 50 MG tablet    2. Class 3 severe obesity due to excess calories without serious comorbidity with body mass index (BMI) of 40.0 to 44.9 in adult  E66.01     Z68.41        RECOMMENDATIONS: Leslie Rodriguez is a 57 y.o. female whose past medical history and cardiac risk factors include: Hypertension and obesity.  Essential hypertension Office blood pressures are not well-controlled. Home blood pressures are also not at goal. Currently on chlorthalidone in the morning. No recent labs to evaluate kidney function and electrolytes.  Will check BMP and magnesium today. Will also start her on losartan 50 mg p.o. every afternoon. After being on losartan for 1 week she is advised to get repeat labs. Reemphasized importance of a low-salt diet. She needs to exercise at  least 30 minutes a day 5 days a week moderate intensity as tolerated. Reemphasized importance of plant-based/DASH diet. Also encouraged her to follow-up with PCP and/or discussed weight loss management which will help facilitate improvement in her blood pressures as well.  Class 3 severe obesity due to excess calories without serious comorbidity with body mass index (BMI) of 40.0 to 44.9 in adult Body mass index is 44.46 kg/m. I reviewed with her importance of diet, regular physical activity/exercise, weight loss.   Patient is educated on the importance of increasing physical activity gradually as tolerated with a goal of moderate intensity exercise for 30 minutes a day 5 days a week.  FINAL MEDICATION LIST END OF ENCOUNTER: Meds ordered this encounter  Medications   losartan (COZAAR) 50 MG tablet    Sig: Take 1 tablet (50 mg total) by mouth daily at 10 pm.    Dispense:  30 tablet    Refill:  0    Medications Discontinued During This Encounter  Medication Reason   busPIRone (BUSPAR) 7.5 MG tablet Patient Preference   CONTRAVE 8-90 MG TB12 Patient Preference   linaclotide (LINZESS) 290 MCG CAPS capsule Patient Preference     Current Outpatient Medications:    acetaminophen (TYLENOL) 500 MG tablet, Take 1,000 mg by mouth every 8 (eight) hours as needed for moderate pain., Disp: ,  Rfl:    chlorthalidone (HYGROTON) 25 MG tablet, TAKE 1/2 TABLET BY MOUTH EVERY AM, Disp: 270 tablet, Rfl: 0   omeprazole (PRILOSEC) 20 MG capsule, Take 1 capsule (20 mg total) by mouth daily before breakfast., Disp: 30 capsule, Rfl: 5   ondansetron (ZOFRAN) 4 MG tablet, Take 1 tablet (4 mg total) by mouth every 8 (eight) hours as needed for nausea or vomiting., Disp: 30 tablet, Rfl: 1   losartan (COZAAR) 50 MG tablet, Take 1 tablet (50 mg total) by mouth daily at 10 pm., Disp: 30 tablet, Rfl: 0  Orders Placed This Encounter  Procedures   Basic metabolic panel   Magnesium   EKG 12-Lead   There are no Patient Instructions on file for this visit.   --Continue cardiac medications as reconciled in final medication list. --Return in about 1 year (around 07/27/2023) for Follow up, BP. Or sooner if needed. --Continue follow-up with your primary care physician regarding the management of your other chronic comorbid conditions.  Patient's questions and concerns were addressed to her satisfaction. She voices understanding of the instructions provided during this encounter.   This note was created using a voice recognition software as a result there may be grammatical errors inadvertently enclosed that do not reflect the nature of this encounter. Every attempt is made to correct such errors.  Tessa Lerner, [place], [facility]  Pager:  [phone] Office: [phone]

## 2022-07-28 LAB — BASIC METABOLIC PANEL
BUN/Creatinine Ratio: 13 (ref 9–23)
BUN: 12 mg/dL (ref 6–24)
CO2: 29 mmol/L (ref 20–29)
Calcium: 10.2 mg/dL (ref 8.7–10.2)
Chloride: 98 mmol/L (ref 96–106)
Creatinine, Ser: 0.96 mg/dL (ref 0.57–1.00)
Glucose: 95 mg/dL (ref 70–99)
Potassium: 4.1 mmol/L (ref 3.5–5.2)
Sodium: 144 mmol/L (ref 134–144)
eGFR: 69 mL/min/{1.73_m2} (ref >59)

## 2022-07-28 LAB — MAGNESIUM: Magnesium: 1.9 mg/dL (ref 1.6–2.3)

## 2022-07-31 ENCOUNTER — Other Ambulatory Visit: Payer: Self-pay

## 2022-07-31 DIAGNOSIS — I10 Essential (primary) hypertension: Secondary | ICD-10-CM

## 2022-07-31 NOTE — Progress Notes (Signed)
Called patient to inform her about her lab results. Patient understood

## 2022-08-04 LAB — BMP8+EGFR
BUN/Creatinine Ratio: 12 (ref 9–23)
BUN: 11 mg/dL (ref 6–24)
CO2: 28 mmol/L (ref 20–29)
Calcium: 9.7 mg/dL (ref 8.7–10.2)
Chloride: 101 mmol/L (ref 96–106)
Creatinine, Ser: 0.93 mg/dL (ref 0.57–1.00)
Glucose: 89 mg/dL (ref 70–99)
Potassium: 4.2 mmol/L (ref 3.5–5.2)
Sodium: 142 mmol/L (ref 134–144)
eGFR: 72 mL/min/{1.73_m2} (ref >59)

## 2022-08-08 NOTE — Progress Notes (Signed)
Gave patient results, she acknowledged information.

## 2022-08-22 NOTE — Progress Notes (Unsigned)
Referring Provider: Leilani Able, MD Primary Care Physician:  Leilani Able, MD Primary GI Physician: Dr. Jena Gauss.  No chief complaint on file.   HPI:   Leslie Rodriguez is a 57 y.o. female with a history of GERD, constipation, internal hemorrhoids s/p banding in 2018. Possible history of ischemic colitis  in 2021 and 2022, intermittent episodes of acute on chronic constipation, gas/bloating, and nausea of unknown etiology, presenting today for ***.   Last seen via virtual visit 01/19/2022.  Patient had sent a MyChart message reporting nausea, constipation, bloating, rectal bleeding intermittently.  Had an episode of some looser stool as well.  At the time of her office visit, she reported usually bright red blood on toilet tissue with little bit the clots.  Had a lot of pain before and some pain after.  Felt similar to when she had ischemic colitis.  Overall, pain had gotten better.  Had not had a bowel movement in 4 days.  Still having some nausea, but Zofran working fairly well.  Recommended continuing Linzess 290 mcg daily, take MiraLAX twice daily for the next 2 days, then resume once daily dosing continue omeprazole 20 mg, bland diet, continue Zofran, metronidazole 500 mg twice daily x 5 days.   Today:    Colonoscopy December 2021 with hemorrhoids, pancolonic diverticulosis, and redundant colon.   Past Medical History:  Diagnosis Date   Anxiety    Depression    Hypertension    Obesity     Past Surgical History:  Procedure Laterality Date   ABDOMINAL HYSTERECTOMY  2003   CESAREAN SECTION  1985   COLONOSCOPY  01/16/2006   RMR: normal rectum/left sided transverse diverticula   COLONOSCOPY N/A 09/21/2015   Dr. Jena Gauss: internal hemorrhoids, diverticulosis    COLONOSCOPY WITH PROPOFOL N/A 03/31/2020   hemorrhoids, pancolonic diverticulosis, redundant colon.     Current Outpatient Medications  Medication Sig Dispense Refill   acetaminophen (TYLENOL) 500 MG tablet Take 1,000 mg  by mouth every 8 (eight) hours as needed for moderate pain.     chlorthalidone (HYGROTON) 25 MG tablet TAKE 1/2 TABLET BY MOUTH EVERY AM 270 tablet 0   losartan (COZAAR) 50 MG tablet TAKE 1 TABLET(50 MG) BY MOUTH DAILY AT 10 PM 90 tablet 3   omeprazole (PRILOSEC) 20 MG capsule Take 1 capsule (20 mg total) by mouth daily before breakfast. 30 capsule 5   ondansetron (ZOFRAN) 4 MG tablet Take 1 tablet (4 mg total) by mouth every 8 (eight) hours as needed for nausea or vomiting. 30 tablet 1   No current facility-administered medications for this visit.    Allergies as of 08/23/2022 - Review Complete 07/27/2022  Allergen Reaction Noted   Ciprofloxacin hcl Hives and Shortness Of Breath 07/27/2011    Family History  Problem Relation Age of Onset   Edema Mother    Colon cancer Neg Hx     Social History   Socioeconomic History   Marital status: Single    Spouse name: Not on file   Number of children: 4   Years of education: Not on file   Highest education level: Not on file  Occupational History   Not on file  Tobacco Use   Smoking status: Never   Smokeless tobacco: Never  Vaping Use   Vaping Use: Never used  Substance and Sexual Activity   Alcohol use: Yes    Comment: socially   Drug use: No   Sexual activity: Not Currently  Other Topics Concern  Not on file  Social History Narrative   Not on file   Social Determinants of Health   Financial Resource Strain: Not on file  Food Insecurity: Not on file  Transportation Needs: Not on file  Physical Activity: Not on file  Stress: Not on file  Social Connections: Not on file    Review of Systems: Gen: Denies fever, chills, anorexia. Denies fatigue, weakness, weight loss.  CV: Denies chest pain, palpitations, syncope, peripheral edema, and claudication. Resp: Denies dyspnea at rest, cough, wheezing, coughing up blood, and pleurisy. GI: Denies vomiting blood, jaundice, and fecal incontinence.   Denies dysphagia or  odynophagia. Derm: Denies rash, itching, dry skin Psych: Denies depression, anxiety, memory loss, confusion. No homicidal or suicidal ideation.  Heme: Denies bruising, bleeding, and enlarged lymph nodes.  Physical Exam: There were no vitals taken for this visit. General:   Alert and oriented. No distress noted. Pleasant and cooperative.  Head:  Normocephalic and atraumatic. Eyes:  Conjuctiva clear without scleral icterus. Heart:  S1, S2 present without murmurs appreciated. Lungs:  Clear to auscultation bilaterally. No wheezes, rales, or rhonchi. No distress.  Abdomen:  +BS, soft, non-tender and non-distended. No rebound or guarding. No HSM or masses noted. Msk:  Symmetrical without gross deformities. Normal posture. Extremities:  Without edema. Neurologic:  Alert and  oriented x4 Psych:  Normal mood and affect.    Assessment:     Plan:  ***   Ermalinda Memos, PA-C [facility] 08/23/2022

## 2022-08-23 ENCOUNTER — Ambulatory Visit (INDEPENDENT_AMBULATORY_CARE_PROVIDER_SITE_OTHER): Payer: 59 | Admitting: Gastroenterology

## 2022-08-23 ENCOUNTER — Encounter: Payer: Self-pay | Admitting: Gastroenterology

## 2022-08-23 VITALS — BP 126/88 | HR 75 | Temp 98.2°F | Ht 63.0 in | Wt 252.0 lb

## 2022-08-23 DIAGNOSIS — K581 Irritable bowel syndrome with constipation: Secondary | ICD-10-CM | POA: Diagnosis not present

## 2022-08-23 DIAGNOSIS — K219 Gastro-esophageal reflux disease without esophagitis: Secondary | ICD-10-CM

## 2022-08-23 MED ORDER — LINACLOTIDE 290 MCG PO CAPS
290.0000 ug | ORAL_CAPSULE | Freq: Every day | ORAL | 5 refills | Status: DC
Start: 2022-08-23 — End: 2023-04-07

## 2022-08-23 NOTE — Patient Instructions (Signed)
Continue taking Linzess 290 mcg daily.  Continue taking Benefiber twice daily.  Continue to use MiraLAX 17 g in 8 ounces of water or other noncarbonated beverage once/twice daily as needed for breakthrough constipation.  Continue using omeprazole 20 mg daily for reflux.  I will work on completing your forms for work over the next couple of days.  Will call you next week when they are ready for pickup.  We will plan to see you back in about 6 months.  Do not hesitate to call sooner if you have questions or concerns.  Ermalinda Memos, PA-C [facility]

## 2022-08-27 ENCOUNTER — Other Ambulatory Visit: Payer: Self-pay | Admitting: Cardiology

## 2022-08-27 DIAGNOSIS — I10 Essential (primary) hypertension: Secondary | ICD-10-CM

## 2022-09-03 ENCOUNTER — Telehealth: Payer: Self-pay | Admitting: Internal Medicine

## 2022-09-03 NOTE — Telephone Encounter (Signed)
Noted  

## 2022-09-03 NOTE — Telephone Encounter (Signed)
I left message on answering machine for the patient. Her forms are ready to be picked up and there's a $29 fee. Forms are in the sample drawer up front.

## 2023-02-19 ENCOUNTER — Ambulatory Visit: Payer: 59 | Admitting: Gastroenterology

## 2023-02-27 ENCOUNTER — Ambulatory Visit (INDEPENDENT_AMBULATORY_CARE_PROVIDER_SITE_OTHER): Payer: 59 | Admitting: Gastroenterology

## 2023-02-27 ENCOUNTER — Encounter: Payer: Self-pay | Admitting: Gastroenterology

## 2023-02-27 VITALS — BP 124/84 | HR 84 | Temp 97.8°F | Ht 63.0 in | Wt 256.1 lb

## 2023-02-27 DIAGNOSIS — K581 Irritable bowel syndrome with constipation: Secondary | ICD-10-CM

## 2023-02-27 DIAGNOSIS — K219 Gastro-esophageal reflux disease without esophagitis: Secondary | ICD-10-CM | POA: Diagnosis not present

## 2023-02-27 MED ORDER — TRULANCE 3 MG PO TABS: 1.0000 | ORAL_TABLET | Freq: Every day | ORAL | 3 refills | Status: DC

## 2023-02-27 MED ORDER — ONDANSETRON HCL 4 MG PO TABS: 4.0000 mg | ORAL_TABLET | Freq: Three times a day (TID) | ORAL | 3 refills | Status: AC | PRN

## 2023-02-27 NOTE — Patient Instructions (Signed)
Let's stop Linzess. Instead, start Trulance once daily with or without food. Continue Benefiber.  I have sent in a prescription in case this works well for you! Regardless, please message me with an update in about a week!  We will see you in 3 months!   I enjoyed seeing you again today! I value our relationship and want to provide genuine, compassionate, and quality care. You may receive a survey regarding your visit with me, and I welcome your feedback! Thanks so much for taking the time to complete this. I look forward to seeing you again.      Gelene Mink, PhD, ANP-BC [facility]

## 2023-02-27 NOTE — Progress Notes (Signed)
Gastroenterology Office Note     Primary Care Physician:  Leilani Able, MD  Primary Gastroenterologist: Dr. Jena Gauss    Chief Complaint   Chief Complaint  Patient presents with   Constipation    Follow up on constipation. Still having stomach pain and gas. Has seen blood in stool on two occasions since last being seen one time in October, and one time this month. Takes linzess 290 mcg daily and miralax and benefiber as needed.    Gastroesophageal Reflux    Follow up on GERD. States reflux is good as long as she takes omeprazole in the mornings.    Nausea    Follow up on nausea and vomiting. Needs refill on zofran.      History of Present Illness   Leslie Rodriguez is a 57 y.o. female presenting today with a history of GERD, constipation, internal hemorrhoids s/p banding in 2018. Possible history of ischemic colitis  in 2021 and 2022, intermittent episodes of acute on chronic constipation, gas/bloating, and nausea of unknown etiology, presenting today for follow-up.   2 episodes of abdominal pain and gas on Sunday and Monday. Last was in October. Worried it was back to back. Episode of abdominal pain after she missed a day of BM and then had BM next day with blood. Will not have a BM for a few days but so full of gas and feel bloated. Will have lots of gas and then has a blow out with some blood.   Taking Linzess 290 mcg daily. Many years has taken Linzesss. She takes the Miralax if starts missing bowel movements and with gas. Benefiber every day. Has tried Amitiza in the past without improvement.   Takes omeprazole each morning and does well as long as does not miss this dose. Only has nausea increased when constipated. Would like refill on Zofran.  Has trips upcoming to [place].    Colonoscopy December 2021 with hemorrhoids, pancolonic diverticulosis, and redundant colon.   Past Medical History:  Diagnosis Date   Anxiety    Constipation    likely IBS-C   Depression    GERD  (gastroesophageal reflux disease)    Hypertension    Obesity     Past Surgical History:  Procedure Laterality Date   ABDOMINAL HYSTERECTOMY  2003   CESAREAN SECTION  1985   COLONOSCOPY  01/16/2006   RMR: normal rectum/left sided transverse diverticula   COLONOSCOPY N/A 09/21/2015   Dr. Jena Gauss: internal hemorrhoids, diverticulosis    COLONOSCOPY WITH PROPOFOL N/A 03/31/2020   hemorrhoids, pancolonic diverticulosis, redundant colon.     Current Outpatient Medications  Medication Sig Dispense Refill   acetaminophen (TYLENOL) 500 MG tablet Take 1,000 mg by mouth every 8 (eight) hours as needed for moderate pain.     chlorthalidone (HYGROTON) 25 MG tablet TAKE 1/2 TABLET BY MOUTH EVERY AM 270 tablet 0   linaclotide (LINZESS) 290 MCG CAPS capsule Take 1 capsule (290 mcg total) by mouth daily before breakfast. 30 capsule 5   losartan (COZAAR) 50 MG tablet TAKE 1 TABLET(50 MG) BY MOUTH DAILY AT 10 PM 90 tablet 3   omeprazole (PRILOSEC) 20 MG capsule Take 1 capsule (20 mg total) by mouth daily before breakfast. 30 capsule 5   ondansetron (ZOFRAN) 4 MG tablet Take 1 tablet (4 mg total) by mouth every 8 (eight) hours as needed for nausea or vomiting. 30 tablet 1   polyethylene glycol powder (MIRALAX) 17 GM/SCOOP powder Take 1 Container by mouth daily. Takes  as needed     Wheat Dextrin (BENEFIBER DRINK MIX PO) Take by mouth. Takes as needed     ZEPBOUND 2.5 MG/0.5ML Pen Inject 2.5 mg into the skin once a week.     No current facility-administered medications for this visit.    Allergies as of 02/27/2023 - Review Complete 02/27/2023  Allergen Reaction Noted   Ciprofloxacin hcl Hives and Shortness Of Breath 07/27/2011    Family History  Problem Relation Age of Onset   Edema Mother    Colon cancer Neg Hx     Social History   Socioeconomic History   Marital status: Single    Spouse name: Not on file   Number of children: 4   Years of education: Not on file   Highest education level:  Not on file  Occupational History   Not on file  Tobacco Use   Smoking status: Never   Smokeless tobacco: Never  Vaping Use   Vaping status: Never Used  Substance and Sexual Activity   Alcohol use: Yes    Comment: socially   Drug use: No   Sexual activity: Not Currently  Other Topics Concern   Not on file  Social History Narrative   Not on file   Social Determinants of Health   Financial Resource Strain: Not on file  Food Insecurity: Not on file  Transportation Needs: Not on file  Physical Activity: Not on file  Stress: Not on file  Social Connections: Not on file  Intimate Partner Violence: Not on file     Review of Systems   Gen: Denies any fever, chills, fatigue, weight loss, lack of appetite.  CV: Denies chest pain, heart palpitations, peripheral edema, syncope.  Resp: Denies shortness of breath at rest or with exertion. Denies wheezing or cough.  GI: Denies dysphagia or odynophagia. Denies jaundice, hematemesis, fecal incontinence. GU : Denies urinary burning, urinary frequency, urinary hesitancy MS: Denies joint pain, muscle weakness, cramps, or limitation of movement.  Derm: Denies rash, itching, dry skin Psych: Denies depression, anxiety, memory loss, and confusion Heme: Denies bruising, bleeding, and enlarged lymph nodes.   Physical Exam   BP 124/84 (BP Location: Right Arm, Patient Position: Sitting, Cuff Size: Large)   Pulse 84   Temp 97.8 F (36.6 C) (Oral)   Ht 5\' 3"  (1.6 m)   Wt 256 lb 1.6 oz (116.2 kg)   BMI 45.37 kg/m  General:   Alert and oriented. Pleasant and cooperative. Well-nourished and well-developed.  Head:  Normocephalic and atraumatic. Eyes:  Without icterus Abdomen:  +BS, soft, non-tender and non-distended. No HSM noted. No guarding or rebound. No masses appreciated.  Rectal:  Deferred  Msk:  Symmetrical without gross deformities. Normal posture. Extremities:  Without edema. Neurologic:  Alert and  oriented x4;  grossly normal  neurologically. Skin:  Intact without significant lesions or rashes. Psych:  Alert and cooperative. Normal mood and affect.   Assessment   Leslie Rodriguez is a 57 y.o. female presenting today with a history of  GERD, constipation, internal hemorrhoids s/p banding in 2018, intermittent rectal bleeding suspected due to internal hemorrhoids in setting of constipation.  IBS-C: Linzess not as efficacious as has been previously. I note she has been on this many years. Previously failed Amitiza. Trial Trulance. Continue benefiber.  Rectal bleeding: known history of internal hemorrhoids s/p banding in the past. Noted bleeding after bouts with constipation. Could consider banding again in the future if persists. Addressing constipation for now. Colonoscopy on file  from 2021 with hemorrhoids. No FH colon cancer or polyps.    GERD controlled on omeprazole daily. Will refill Zofran for prn nausea (usually associated with bouts of constipation).   PLAN  Trial of Trulance Continue omeprazole daily Zofran refilled Return in 3 months Trulance rx sent to pharmacy in case this works well. Message with update.    Gelene Mink, PhD, ANP-BC [facility] Gastroenter[facility]

## 2023-03-06 ENCOUNTER — Other Ambulatory Visit: Payer: Self-pay | Admitting: Cardiology

## 2023-03-06 DIAGNOSIS — I10 Essential (primary) hypertension: Secondary | ICD-10-CM

## 2023-04-01 DIAGNOSIS — K581 Irritable bowel syndrome with constipation: Secondary | ICD-10-CM

## 2023-04-04 NOTE — Telephone Encounter (Signed)
As Trulance is not effective, we can trial Motegrity if she is willing, or go back to Linzess.

## 2023-04-07 MED ORDER — LINACLOTIDE 290 MCG PO CAPS: 290.0000 ug | ORAL_CAPSULE | Freq: Every day | ORAL | 3 refills | Status: DC

## 2023-04-07 NOTE — Addendum Note (Signed)
Addended by: Gelene Mink on: 04/07/2023 06:36 PM   Modules accepted: Orders

## 2023-05-10 NOTE — Telephone Encounter (Signed)
Phoned and spoke with the pt and was advised by her she can wait for [name] to return. She states she was just letting her know what was going on. I asked the pt did she need an appt sooner than 05/23/2023 and she responded "no". Advised her that I will still let it be known to which the pt expressed understanding

## 2023-05-23 ENCOUNTER — Ambulatory Visit (INDEPENDENT_AMBULATORY_CARE_PROVIDER_SITE_OTHER): Payer: Commercial Managed Care - PPO | Admitting: Gastroenterology

## 2023-05-23 ENCOUNTER — Encounter: Payer: Self-pay | Admitting: Gastroenterology

## 2023-05-23 ENCOUNTER — Encounter: Payer: Self-pay | Admitting: *Deleted

## 2023-05-23 VITALS — BP 123/81 | HR 79 | Temp 98.2°F | Ht 63.0 in | Wt 240.6 lb

## 2023-05-23 DIAGNOSIS — K5900 Constipation, unspecified: Secondary | ICD-10-CM

## 2023-05-23 DIAGNOSIS — K581 Irritable bowel syndrome with constipation: Secondary | ICD-10-CM

## 2023-05-23 DIAGNOSIS — Z8719 Personal history of other diseases of the digestive system: Secondary | ICD-10-CM

## 2023-05-23 DIAGNOSIS — K219 Gastro-esophageal reflux disease without esophagitis: Secondary | ICD-10-CM | POA: Diagnosis not present

## 2023-05-23 NOTE — Progress Notes (Signed)
 Gastroenterology Office Note     Primary Care Physician:  Ilah Crigler, MD  Primary Gastroenterologist: Dr. Shaaron    Chief Complaint   Chief Complaint  Patient presents with   Follow-up    Doing well, no issues     History of Present Illness   Leslie Rodriguez is a 58 y.o. female presenting today with a history of GERD, constipation, internal hemorrhoids s/p banding in 2018. Possible history of ischemic colitis in 2021 and 2022, presenting today for follow-up.   Had an episode of nausea and bleeding in January. Had periumbilical pain out of nowhere, urge to have BM and when goes it's just blood. Had two episodes of heavy bleeding and afterwards slacking off. Pictures sent in MyChart. No constipation prior to this. No postprandial abdominal pain or food fear. Denies episodes of hypotension.   Going on a superbowl cruise starting tomorrow. [place], [address] [place], [place].   Taking Linzess  290 mcg daily. Feels like working well for constipation. Nausea on Zepbound.   Colonoscopy December 2021 with hemorrhoids, pancolonic diverticulosis, and redundant colon.   Past Medical History:  Diagnosis Date   Anxiety    Constipation    likely IBS-C   Depression    GERD (gastroesophageal reflux disease)    Hypertension    Obesity     Past Surgical History:  Procedure Laterality Date   ABDOMINAL HYSTERECTOMY  2003   CESAREAN SECTION  1985   COLONOSCOPY  01/16/2006   RMR: normal rectum/left sided transverse diverticula   COLONOSCOPY N/A 09/21/2015   Dr. Shaaron: internal hemorrhoids, diverticulosis    COLONOSCOPY WITH PROPOFOL  N/A 03/31/2020   hemorrhoids, pancolonic diverticulosis, redundant colon.     Current Outpatient Medications  Medication Sig Dispense Refill   acetaminophen  (TYLENOL ) 500 MG tablet Take 1,000 mg by mouth every 8 (eight) hours as needed for moderate pain.     chlorthalidone  (HYGROTON ) 25 MG tablet TAKE 1/2 TABLET BY MOUTH EVERY MORNING 45 tablet 1    linaclotide  (LINZESS ) 290 MCG CAPS capsule Take 1 capsule (290 mcg total) by mouth daily before breakfast. 90 capsule 3   losartan  (COZAAR ) 50 MG tablet TAKE 1 TABLET(50 MG) BY MOUTH DAILY AT 10 PM 90 tablet 3   omeprazole  (PRILOSEC) 20 MG capsule Take 1 capsule (20 mg total) by mouth daily before breakfast. 30 capsule 5   ondansetron  (ZOFRAN ) 4 MG tablet Take 1 tablet (4 mg total) by mouth every 8 (eight) hours as needed for nausea or vomiting. 30 tablet 3   polyethylene glycol powder (MIRALAX) 17 GM/SCOOP powder Take 1 Container by mouth daily. Takes as needed     Wheat Dextrin (BENEFIBER DRINK MIX PO) Take by mouth. Takes as needed     ZEPBOUND 2.5 MG/0.5ML Pen Inject 2.5 mg into the skin once a week. (Patient not taking: Reported on 05/23/2023)     No current facility-administered medications for this visit.    Allergies as of 05/23/2023 - Review Complete 05/23/2023  Allergen Reaction Noted   Ciprofloxacin hcl Hives and Shortness Of Breath 07/27/2011    Family History  Problem Relation Age of Onset   Edema Mother    Colon cancer Neg Hx     Social History   Socioeconomic History   Marital status: Single    Spouse name: Not on file   Number of children: 4   Years of education: Not on file   Highest education level: Not on file  Occupational History   Not on file  Tobacco Use   Smoking status: Never   Smokeless tobacco: Never  Vaping Use   Vaping status: Never Used  Substance and Sexual Activity   Alcohol use: Yes    Comment: socially   Drug use: No   Sexual activity: Not Currently  Other Topics Concern   Not on file  Social History Narrative   Not on file   Social Drivers of Health   Financial Resource Strain: Not on file  Food Insecurity: Not on file  Transportation Needs: Not on file  Physical Activity: Not on file  Stress: Not on file  Social Connections: Not on file  Intimate Partner Violence: Not on file     Review of Systems   Gen: Denies any fever,  chills, fatigue, weight loss, lack of appetite.  CV: Denies chest pain, heart palpitations, peripheral edema, syncope.  Resp: Denies shortness of breath at rest or with exertion. Denies wheezing or cough.  GI: Denies dysphagia or odynophagia. Denies jaundice, hematemesis, fecal incontinence. GU : Denies urinary burning, urinary frequency, urinary hesitancy MS: Denies joint pain, muscle weakness, cramps, or limitation of movement.  Derm: Denies rash, itching, dry skin Psych: Denies depression, anxiety, memory loss, and confusion Heme: Denies bruising, bleeding, and enlarged lymph nodes.   Physical Exam   BP 123/81 (BP Location: Right Arm, Patient Position: Sitting, Cuff Size: Large)   Pulse 79   Temp 98.2 F (36.8 C) (Oral)   Ht 5' 3 (1.6 m)   Wt 240 lb 9.6 oz (109.1 kg)   SpO2 95%   BMI 42.62 kg/m  General:   Alert and oriented. Pleasant and cooperative. Well-nourished and well-developed.  Head:  Normocephalic and atraumatic. Eyes:  Without icterus Abdomen:  +BS, soft, non-tender and non-distended. No HSM noted. No guarding or rebound. No masses appreciated.  Rectal:  Deferred  Msk:  Symmetrical without gross deformities. Normal posture. Extremities:  Without edema. Neurologic:  Alert and  oriented x4;  grossly normal neurologically. Skin:  Intact without significant lesions or rashes. Psych:  Alert and cooperative. Normal mood and affect.   Assessment   Leslie Rodriguez is a 58 y.o. female presenting today with a history of  GERD, constipation, internal hemorrhoids s/p banding in 2018, possible ischemic colitis episodes in 2021/2022, now with recurrent episode since last seen.  Her symptoms of acute abdominal pain followed by rectal bleeding and then tapering, improvement with supportive measures, etc., are suspicious for ischemic colitis. Interestingly, she has no symptoms in between episodes, food fear, postprandial pain, etc. We will pursue CTA in near future. Colonoscopy  December 2021 with hemorrhoids, pancolonic diverticulosis, and redundant colon.   Continue Linzess  290 mcg daily for constipation and PPI for GERD.    PLAN   CTA in [place] Continue Linzess  and PPI Return in 6 months    Leslie MICAEL Stager, PhD, [facility] Gastroenterology

## 2023-05-23 NOTE — Patient Instructions (Signed)
 We are ordering a special CT scan of your abdomen.  Have a wonderful time on the cruise!!  We will see you in 6 months!  I enjoyed seeing you again today! I value our relationship and want to provide genuine, compassionate, and quality care. You may receive a survey regarding your visit with me, and I welcome your feedback! Thanks so much for taking the time to complete this. I look forward to seeing you again.      Therisa MICAEL Stager, PhD, ANP-BC [facility] Gastroenterology

## 2023-05-31 ENCOUNTER — Telehealth: Payer: Self-pay | Admitting: *Deleted

## 2023-05-31 NOTE — Telephone Encounter (Signed)
Melissa from pre-service center left vm stating pt needs to have PA for CT.  PA was done via [facility] and it said that The member is not in scope for prior-authorization/notification for the services requested. If you feel this is an error or have questions around what services require prior-authorization/notification, please call the number on back of the member's ID card.  Spoke with Efraim Kaufmann and she says that she called the members insurance and PA is required.   Called and spoke with representative with pt's insurance. PA was submitted and is pending. Case # [id number]. If approved, it will be from dates 06/05/23-09/03/23.  Clinicals have been faxed for the PA

## 2023-06-03 NOTE — Telephone Encounter (Signed)
[employer] PA:  Still pending

## 2023-06-05 ENCOUNTER — Ambulatory Visit (HOSPITAL_COMMUNITY): Payer: Commercial Managed Care - PPO

## 2023-07-23 ENCOUNTER — Encounter: Payer: Self-pay | Admitting: Gastroenterology

## 2023-07-26 ENCOUNTER — Ambulatory Visit: Payer: Self-pay | Admitting: Cardiology

## 2023-08-29 ENCOUNTER — Other Ambulatory Visit: Payer: Self-pay | Admitting: Cardiology

## 2023-08-29 DIAGNOSIS — I10 Essential (primary) hypertension: Secondary | ICD-10-CM

## 2023-09-14 ENCOUNTER — Other Ambulatory Visit: Payer: Self-pay | Admitting: Cardiology

## 2023-09-14 DIAGNOSIS — I10 Essential (primary) hypertension: Secondary | ICD-10-CM

## 2023-09-16 ENCOUNTER — Other Ambulatory Visit: Payer: Self-pay | Admitting: Cardiology

## 2023-09-16 DIAGNOSIS — I10 Essential (primary) hypertension: Secondary | ICD-10-CM

## 2023-10-01 ENCOUNTER — Telehealth: Payer: Self-pay

## 2023-10-01 NOTE — Telephone Encounter (Signed)
 Returned the pt's call and was advised by here she is having really bad cramping in her abdomen. She poops only a little bit. This has been going on for 2 weeks now.and she had phoned the front desk for an appt but they said you didn't have anything available. Pt is wondering if she needs to go to ED. Please advise

## 2023-10-01 NOTE — Telephone Encounter (Signed)
 Make sure she is taking Linzess  every day. Increase Miralax to twice a day to get things moving.  We had ordered a CTA but I don't see where this done.  She can see any APP at this point if I don't have an availability as she does need an appointment.

## 2023-10-02 ENCOUNTER — Other Ambulatory Visit: Payer: Self-pay | Admitting: Gastroenterology

## 2023-10-02 DIAGNOSIS — Z8719 Personal history of other diseases of the digestive system: Secondary | ICD-10-CM

## 2023-10-02 NOTE — Telephone Encounter (Signed)
 Mindy/Tammy: please arrange CTA. We had ordered this already but was on a cruise. Orders should be in there. Reason Is abdominal pain, hx of ischemic colitis.

## 2023-10-02 NOTE — Telephone Encounter (Signed)
 Phoned and advised the pt of your instructions of the medications and procedure which she agreed to. Please have the pt to be rescheduled for the CTA and she will go. (She was on a cruise last time)

## 2023-10-02 NOTE — Telephone Encounter (Signed)
 Called pt's insurance: CTA approval # [id number] DOS: 10/02/23-11/14/23.

## 2023-10-02 NOTE — Telephone Encounter (Signed)
 error

## 2023-10-11 ENCOUNTER — Ambulatory Visit (HOSPITAL_COMMUNITY)

## 2023-10-25 ENCOUNTER — Ambulatory Visit (HOSPITAL_COMMUNITY)
Admission: RE | Admit: 2023-10-25 | Discharge: 2023-10-25 | Disposition: A | Discharge status: Home / Self Care | Source: Ambulatory Visit | Attending: Gastroenterology | Admitting: Gastroenterology

## 2023-10-25 DIAGNOSIS — Z8719 Personal history of other diseases of the digestive system: Secondary | ICD-10-CM | POA: Insufficient documentation

## 2023-10-25 MED ORDER — IOHEXOL 350 MG/ML SOLN
100.0000 mL | Freq: Once | INTRAVENOUS | Status: AC | PRN
  Administered 2023-10-25: 100 mL via INTRAVENOUS

## 2023-10-27 ENCOUNTER — Ambulatory Visit: Payer: Self-pay | Admitting: Gastroenterology

## 2023-11-20 ENCOUNTER — Other Ambulatory Visit: Payer: Self-pay | Admitting: Cardiology

## 2023-11-20 DIAGNOSIS — I10 Essential (primary) hypertension: Secondary | ICD-10-CM

## 2023-11-21 ENCOUNTER — Ambulatory Visit: Payer: Commercial Managed Care - PPO | Admitting: Gastroenterology

## 2023-11-26 ENCOUNTER — Ambulatory Visit: Admitting: Gastroenterology

## 2023-11-26 VITALS — BP 162/99 | HR 73 | Temp 98.5°F | Ht 63.0 in | Wt 244.0 lb

## 2023-11-26 DIAGNOSIS — R109 Unspecified abdominal pain: Secondary | ICD-10-CM

## 2023-11-26 DIAGNOSIS — K581 Irritable bowel syndrome with constipation: Secondary | ICD-10-CM

## 2023-11-26 DIAGNOSIS — K625 Hemorrhage of anus and rectum: Secondary | ICD-10-CM

## 2023-11-26 NOTE — Progress Notes (Addendum)
 Gastroenterology Office Note     Primary Care Physician:  Ilah Crigler, MD  Primary Gastroenterologist: Dr. Shaaron    Chief Complaint   Chief Complaint  Patient presents with   Follow-up    Follow up after CT and pt has FMLA paperwork     History of Present Illness   Leslie Rodriguez is a delightful 58 y.o. female presenting today with a history of GERD, constipation, internal hemorrhoids s/p banding in 2018. Possible history of ischemic colitis in 2021 and 2022, presenting today for follow-up.  She was last seen in February 2025.  She has had symptoms of acute abdominal pain followed by rectal bleeding suspicious for ischemic colitis but no symptoms of food fear, postprandial abdominal pain, weight loss. CTA July 2025 with patent celiac, SMA, and IMA. No bowel wall thickening.   Today: had bout of sickness following her cruise. Put on abx by PCP for 2 weeks.  She mentions that she believes she had been tested for H. pylori and this is why she had the antibiotics.  Started feeling better and took Zepbound felt sick again with N/V, abdominal pain. Stopped Zepbound.  Every few months will get constipated, associated abdominal pain, will increase Miralax and have BMs, associated bleeding with multiple BMs, then resolution of abdominal pain. No bleeding in between bouts of pain. Will at times will have pain with associated looser stool and has rectal bleeding without taking any miralax. Sometimes just bleeding in the toilet. Notices stress has precipitated these events.   Typically has BM daily to every other day currently. No rectal bleeding. Feels good until episodes hit. Linzess  290 mcg daily. Takes miralax daily and Benefiber.   Otc labs Hgb 13.1, Creaton 0.89, Alk Phos 72, AST 14, ALT 14   Colonoscopy December 2021 with hemorrhoids, pancolonic diverticulosis, and redundant colon    No known FH of IBD. Unknown father's side No first degree relatives of colon cancer Mother:  polyps just removed. Unknown if adenoma.   Past Medical History:  Diagnosis Date   Anxiety    Constipation    likely IBS-C   Depression    GERD (gastroesophageal reflux disease)    Hypertension    Obesity     Past Surgical History:  Procedure Laterality Date   ABDOMINAL HYSTERECTOMY  2003   CESAREAN SECTION  1985   COLONOSCOPY  01/16/2006   RMR: normal rectum/left sided transverse diverticula   COLONOSCOPY N/A 09/21/2015   Dr. Shaaron: internal hemorrhoids, diverticulosis    COLONOSCOPY WITH PROPOFOL  N/A 03/31/2020   hemorrhoids, pancolonic diverticulosis, redundant colon.     Current Outpatient Medications  Medication Sig Dispense Refill   acetaminophen  (TYLENOL ) 500 MG tablet Take 1,000 mg by mouth every 8 (eight) hours as needed for moderate pain.     chlorthalidone  (HYGROTON ) 25 MG tablet Take 0.5 tablets (12.5 mg total) by mouth every morning. PT. MUST MAKE AN APPOINTMENT IN ORDER TO RECEIVE FUTURE REFILLS. FIRST ATTEMPT.. 45 tablet 0   linaclotide  (LINZESS ) 290 MCG CAPS capsule Take 1 capsule (290 mcg total) by mouth daily before breakfast. 90 capsule 3   losartan  (COZAAR ) 50 MG tablet TAKE 1 TABLET(50 MG) BY MOUTH DAILY AT 10 PM 90 tablet 0   omeprazole  (PRILOSEC) 20 MG capsule Take 1 capsule (20 mg total) by mouth daily before breakfast. 30 capsule 5   ondansetron  (ZOFRAN ) 4 MG tablet Take 1 tablet (4 mg total) by mouth every 8 (eight) hours as needed for nausea or vomiting.  30 tablet 3   polyethylene glycol powder (MIRALAX) 17 GM/SCOOP powder Take 1 Container by mouth daily. Takes as needed     Wheat Dextrin (BENEFIBER DRINK MIX PO) Take by mouth. Takes as needed     ZEPBOUND 2.5 MG/0.5ML Pen Inject 2.5 mg into the skin once a week. (Patient not taking: Reported on 11/26/2023)     No current facility-administered medications for this visit.    Allergies as of 11/26/2023 - Review Complete 11/26/2023  Allergen Reaction Noted   Ciprofloxacin hcl Hives and Shortness Of  Breath 07/27/2011    Family History  Problem Relation Age of Onset   Edema Mother    Colon cancer Neg Hx     Social History   Socioeconomic History   Marital status: Single    Spouse name: Not on file   Number of children: 4   Years of education: Not on file   Highest education level: Not on file  Occupational History   Not on file  Tobacco Use   Smoking status: Never   Smokeless tobacco: Never  Vaping Use   Vaping status: Never Used  Substance and Sexual Activity   Alcohol use: Yes    Comment: socially   Drug use: No   Sexual activity: Not Currently  Other Topics Concern   Not on file  Social History Narrative   Not on file   Social Drivers of Health   Financial Resource Strain: Not on file  Food Insecurity: Not on file  Transportation Needs: Not on file  Physical Activity: Not on file  Stress: Not on file  Social Connections: Not on file  Intimate Partner Violence: Not on file     Review of Systems   See HPI   Physical Exam   BP (!) 162/99   Pulse 73   Temp 98.5 F (36.9 C)   Ht 5' 3 (1.6 m)   Wt 244 lb (110.7 kg)   BMI 43.22 kg/m  General:   Alert and oriented. Pleasant and cooperative. Well-nourished and well-developed.  Head:  Normocephalic and atraumatic. Eyes:  Without icterus Abdomen:  +BS, soft, non-tender and non-distended. No HSM noted. No guarding or rebound. No masses appreciated.  Rectal:  large external hemorrhoids, dre without mass, noted to have enlarged internal hemorrhoids  Msk:  Symmetrical without gross deformities. Normal posture. Extremities:  Without edema. Neurologic:  Alert and  oriented x4;  grossly normal neurologically. Skin:  Intact without significant lesions or rashes. Psych:  Alert and cooperative. Normal mood and affect.   Assessment   [name] is a 58 y.o. female presenting today with a history of GERD, constipation, internal hemorrhoids status post banding in 2018, intermittent bouts of abdominal  pain followed by rectal bleeding that was suspicious for ischemic colitis in the past, presenting now for follow-up.  In interim from last visit, she had a bout of sickness following a cruise trip, and it appears that she might have tested positive for H. pylori antibody and was treated with antibiotics by the primary care for 2 weeks.  I suspect that Zepbound was contributing to her bouts of nausea and vomiting, which have since resolved after stopping Zepbound.  In regards to intermittent abdominal pain, I suspect that this is related to constipation predominantly, but I also note that she will sometimes have pain without constipation and followed by rectal bleeding.  CTA is on file negative for any vasculature concerns.  As these episodes are infrequent and she feels well  in between, I doubt we are dealing with an underlying occult IBD, but we will check further serologies and may need updated colonoscopy as last was in 2021 and due to persistent bleeding.  On exam she does have significant internal hemorrhoids, and I suspect this is the culprit for her bleeding when she has these episodes, as she has increased stool frequency.   PLAN   Obtain h.pylori results from outside; will need to document eradicaiton if was positive CBC, crp, sed rate, celiac serologies Continue Linzess , MiraLAX, and fiber May need to perform ileocolonoscopy in the near future. Further recommendations to follow.   Therisa MICAEL Stager, PhD, ANP-BC [facility]   ADDENDUM: CRP and sed rate elevated. Celiac serologies negative. Recommend ileocolonoscopy. I received negative urea breath test results dated March 2025.

## 2023-11-26 NOTE — Patient Instructions (Signed)
 Please have blood work done.  I will message you with the results!  Continue Linzess , Miralax, and fiber.   Further recommendations to follow!  I enjoyed seeing you again today! I value our relationship and want to provide genuine, compassionate, and quality care. You may receive a survey regarding your visit with me, and I welcome your feedback! Thanks so much for taking the time to complete this. I look forward to seeing you again.      Therisa MICAEL Stager, PhD, ANP-BC [facility]

## 2023-11-27 ENCOUNTER — Telehealth: Payer: Self-pay

## 2023-11-27 NOTE — Telephone Encounter (Signed)
 FYI:  Leslie Rodriguez pt's FMLA paperwork has been completed and will just need your signatures. If you have time Friday please come by the office so this can be given to the pt.

## 2023-11-28 ENCOUNTER — Ambulatory Visit: Payer: Commercial Managed Care - PPO | Admitting: Gastroenterology

## 2023-11-28 LAB — CBC WITH DIFFERENTIAL/PLATELET
Basophils Absolute: 0 x10E3/uL (ref 0.0–0.2)
Basos: 1 %
EOS (ABSOLUTE): 0.1 x10E3/uL (ref 0.0–0.4)
Eos: 3 %
Hematocrit: 39.7 % (ref 34.0–46.6)
Hemoglobin: 13.1 g/dL (ref 11.1–15.9)
Immature Grans (Abs): 0 x10E3/uL (ref 0.0–0.1)
Immature Granulocytes: 0 %
Lymphocytes Absolute: 2.5 x10E3/uL (ref 0.7–3.1)
Lymphs: 47 %
MCH: 30.8 pg (ref 26.6–33.0)
MCHC: 33 g/dL (ref 31.5–35.7)
MCV: 93 fL (ref 79–97)
Monocytes Absolute: 0.4 x10E3/uL (ref 0.1–0.9)
Monocytes: 7 %
Neutrophils Absolute: 2.2 x10E3/uL (ref 1.4–7.0)
Neutrophils: 42 %
Platelets: 288 x10E3/uL (ref 150–450)
RBC: 4.26 x10E6/uL (ref 3.77–5.28)
RDW: 13.7 % (ref 11.7–15.4)
WBC: 5.2 x10E3/uL (ref 3.4–10.8)

## 2023-11-28 LAB — C-REACTIVE PROTEIN: CRP: 15 mg/L — ABNORMAL HIGH (ref 0–10)

## 2023-11-28 LAB — SEDIMENTATION RATE: Sed Rate: 52 mm/h — ABNORMAL HIGH (ref 0–40)

## 2023-11-28 LAB — CELIAC DISEASE PANEL
Endomysial IgA: NEGATIVE
IgA/Immunoglobulin A, Serum: 238 mg/dL (ref 87–352)
Transglutaminase IgA: <2 U/mL (ref 0–3)

## 2023-12-02 NOTE — Telephone Encounter (Signed)
Completed and placed on desk

## 2023-12-02 NOTE — Telephone Encounter (Signed)
 FYI: Phoned the pt and advised of FMLA paperwork being placed up front for her/cost. Pt states she will be here sometime before Friday closing.

## 2023-12-04 ENCOUNTER — Ambulatory Visit: Payer: Self-pay

## 2023-12-11 ENCOUNTER — Other Ambulatory Visit: Payer: Self-pay | Admitting: Cardiology

## 2023-12-11 DIAGNOSIS — I10 Essential (primary) hypertension: Secondary | ICD-10-CM

## 2023-12-25 NOTE — Telephone Encounter (Signed)
 Returned the pt's phone call regarding her FMLA paperwork needing #9 changed and filled out differently. This will be faxed to [phone] and scanned to the pt's chart as well as copy mailed to the pt.

## 2023-12-27 NOTE — Telephone Encounter (Signed)
 Message sent to patient, but I am not sure she has seen it. Just need to see what she would like to do.

## 2023-12-30 NOTE — Telephone Encounter (Signed)
 Phoned and spoke with the pt while I was on the phone with her she looked up your message and she agrees to whatever you need her to do and she just advises that she knows that something is going on and just want to find out what it is

## 2024-01-01 ENCOUNTER — Other Ambulatory Visit: Payer: Self-pay | Admitting: *Deleted

## 2024-01-01 ENCOUNTER — Encounter: Payer: Self-pay | Admitting: *Deleted

## 2024-01-01 MED ORDER — PEG 3350-KCL-NA BICARB-NACL 420 G PO SOLR
4000.0000 mL | Freq: Once | ORAL | 0 refills | Status: AC
Start: 2024-01-01 — End: 2024-01-01

## 2024-01-01 NOTE — Telephone Encounter (Signed)
 Please arrange ileocolonoscopy with Dr. Shaaron due to rectal bleeding. ASA 2.  Needs to hold Zepbound X 1 week.

## 2024-01-21 ENCOUNTER — Ambulatory Visit: Attending: Cardiology | Admitting: Cardiology

## 2024-01-21 ENCOUNTER — Encounter: Payer: Self-pay | Admitting: Cardiology

## 2024-01-21 VITALS — BP 122/82 | HR 83 | Resp 16 | Ht 63.0 in | Wt 245.6 lb

## 2024-01-21 DIAGNOSIS — I4510 Unspecified right bundle-branch block: Secondary | ICD-10-CM | POA: Diagnosis not present

## 2024-01-21 DIAGNOSIS — E66813 Obesity, class 3: Secondary | ICD-10-CM | POA: Diagnosis not present

## 2024-01-21 DIAGNOSIS — Z6841 Body Mass Index (BMI) 40.0 and over, adult: Secondary | ICD-10-CM

## 2024-01-21 DIAGNOSIS — I10 Essential (primary) hypertension: Secondary | ICD-10-CM | POA: Diagnosis not present

## 2024-01-21 MED ORDER — CHLORTHALIDONE 25 MG PO TABS: 12.5000 mg | ORAL_TABLET | Freq: Every morning | ORAL | 3 refills | Status: AC

## 2024-01-21 MED ORDER — LOSARTAN POTASSIUM 50 MG PO TABS
50.0000 mg | ORAL_TABLET | Freq: Every day | ORAL | 3 refills | Status: AC
Start: 2024-01-21 — End: ?

## 2024-01-21 NOTE — Progress Notes (Signed)
 Cardiology Office Note:  .   Date:  01/21/2024  ID:  Leslie Rodriguez, DOB Feb 28, 1966, MRN [id number] PCP:  Ilah Crigler, MD  Former Cardiology Providers: None [facility] Providers Cardiologist:  Leslie Large, DO , [facility] (established care 11/25/2019) Electrophysiologist:  None  Click to update primary MD,subspecialty MD or APP then REFRESH:1}    Chief Complaint  Patient presents with   Hypertension   Follow-up    History of Present Illness: .   Leslie Rodriguez is a 58 y.o. African-American female whose past medical history and cardiovascular risk factors includes: Hypertension, obesity, right bundle branch block  Patient was initially referred to the practice back in August 2021 for management of benign essential hypertension and lower extremity swelling.  With medication titration her blood pressures have improved and she presents today for 1 year follow-up visit.    Since last office visit patient denies any anginal chest pain or heart failure symptoms.  No hospitalizations or urgent care visits for cardiovascular reasons.  She has been compliant with her medical therapy.  Physical endurance remains stable, walks 30 minutes everyday. Home SBP are around 120 mmHG.  She recently had a sleep study as well - results are pending.   Review of Systems: .   Review of Systems  Cardiovascular:  Negative for chest pain, claudication, irregular heartbeat, leg swelling, near-syncope, orthopnea, palpitations, paroxysmal nocturnal dyspnea and syncope.  Respiratory:  Negative for shortness of breath.   Hematologic/Lymphatic: Negative for bleeding problem.    Studies Reviewed:   EKG: EKG Interpretation Date/Time:  Tuesday January 21 2024 08:09:34 EDT Ventricular Rate:  74 PR Interval:  150 QRS Duration:  148 QT Interval:  458 QTC Calculation: 508 R Axis:   -43  Text Interpretation: Normal sinus rhythm Left axis deviation Right bundle branch block When compared with ECG of  30-May-2017 03:30, Right bundle branch block has replaced Incomplete right bundle branch block Confirmed by Rodriguez Leslie [phone]) on 01/21/2024 8:13:08 AM  Echocardiogram: 12/09/2019:  Left ventricle cavity is normal in size. Moderate concentric hypertrophy of the left ventricle. Normal global wall motion. Normal LV systolic function with EF 61%. Doppler evidence of grade I (impaired) diastolic dysfunction, normal LAP. Moderate tricuspid regurgitation. Estimated pulmonary artery systolic pressure 36 mmHg.   RADIOLOGY: NA  Risk Assessment/Calculations:   NA   Labs:       Latest Ref Rng & Units 11/26/2023    3:22 PM 06/06/2020   11:17 AM 10/22/2019   12:17 PM  CBC  WBC 3.4 - 10.8 x10E3/uL 5.2  6.5  5.8   Hemoglobin 11.1 - 15.9 g/dL 86.8  87.1  87.2   Hematocrit 34.0 - 46.6 % 39.7  38.6  38.1   Platelets 150 - 450 x10E3/uL 288  348  340        Latest Ref Rng & Units 08/03/2022    3:39 PM 07/27/2022   11:55 AM 06/07/2020    4:16 PM  BMP  Glucose 70 - 99 mg/dL 89  95    BUN 6 - 24 mg/dL 11  12    Creatinine 9.42 - 1.00 mg/dL 9.06  9.03  8.99   BUN/Creat Ratio 9 - 23 12  13     Sodium 134 - 144 mmol/L 142  144    Potassium 3.5 - 5.2 mmol/L 4.2  4.1    Chloride 96 - 106 mmol/L 101  98    CO2 20 - 29 mmol/L 28  29    Calcium  8.7 - 10.2 mg/dL 9.7  89.7        Latest Ref Rng & Units 08/03/2022    3:39 PM 07/27/2022   11:55 AM 06/07/2020    4:16 PM  CMP  Glucose 70 - 99 mg/dL 89  95    BUN 6 - 24 mg/dL 11  12    Creatinine 9.42 - 1.00 mg/dL 9.06  9.03  8.99   Sodium 134 - 144 mmol/L 142  144    Potassium 3.5 - 5.2 mmol/L 4.2  4.1    Chloride 96 - 106 mmol/L 101  98    CO2 20 - 29 mmol/L 28  29    Calcium 8.7 - 10.2 mg/dL 9.7  89.7      No results found for: CHOL, HDL, LDLCALC, LDLDIRECT, TRIG, CHOLHDL No results for input(s): LIPOA in the last 8760 hours. No components found for: NTPROBNP No results for input(s): PROBNP in the last 8760 hours. No results for  input(s): TSH in the last 8760 hours.  Physical Exam:    Today's Vitals   01/21/24 0807  BP: 122/82  Pulse: 83  Resp: 16  SpO2: 93%  Weight: 245 lb 9.6 oz (111.4 kg)  Height: 5' 3 (1.6 m)   Body mass index is 43.51 kg/m. Wt Readings from Last 3 Encounters:  01/21/24 245 lb 9.6 oz (111.4 kg)  11/26/23 244 lb (110.7 kg)  05/23/23 240 lb 9.6 oz (109.1 kg)    Physical Exam  Constitutional: No distress.  hemodynamically stable  Neck: No JVD present.  Cardiovascular: Normal rate, regular rhythm, S1 normal and S2 normal. Exam reveals no gallop, no S3 and no S4.  No murmur heard. Pulmonary/Chest: Effort normal and breath sounds normal. No stridor. She has no wheezes. She has no rales.  Musculoskeletal:        General: No edema.     Cervical back: Neck supple.  Skin: Skin is warm.     Impression & Recommendation(s):  Impression:   ICD-10-CM   1. Essential hypertension  I10 EKG 12-Lead    losartan  (COZAAR ) 50 MG tablet    chlorthalidone  (HYGROTON ) 25 MG tablet    2. RBBB  I45.10     3. Class 3 severe obesity due to excess calories without serious comorbidity with body mass index (BMI) of 40.0 to 44.9 in adult [facility])  Z33.186 EKG 12-Lead   Z68.41        Recommendation(s):  Essential hypertension Office and home blood pressures are now very well-controlled. Chlorthalidone  12.5 mg p.o. every morning, refill. Losartan  50 mg p.o. every afternoon, refill Recently was screened for sleep apnea, results pending. Reemphasize importance of weight loss. Reemphasized importance of low-salt diet.  RBBB Newly discovered. Asymptomatic. Continue to monitor. No additional workup warranted at this time  Class 3 severe obesity due to excess calories without serious comorbidity with body mass index (BMI) of 40.0 to 44.9 in adult [facility]) Body mass index is 43.51 kg/m. I reviewed with her importance of diet, regular physical activity/exercise, weight loss.   Patient is educated on  the importance of increasing physical activity gradually as tolerated with a goal of moderate intensity exercise for 30 minutes a day 5 days a week.  Orders Placed:  Orders Placed This Encounter  Procedures   EKG 12-Lead     Final Medication List:    Meds ordered this encounter  Medications   losartan  (COZAAR ) 50 MG tablet    Sig: Take 1 tablet (50 mg total)  by mouth daily.    Dispense:  90 tablet    Refill:  3   chlorthalidone  (HYGROTON ) 25 MG tablet    Sig: Take 0.5 tablets (12.5 mg total) by mouth every morning.    Dispense:  45 tablet    Refill:  3    Medications Discontinued During This Encounter  Medication Reason   losartan  (COZAAR ) 50 MG tablet Reorder   chlorthalidone  (HYGROTON ) 25 MG tablet Reorder     Current Outpatient Medications:    acetaminophen  (TYLENOL ) 500 MG tablet, Take 1,000 mg by mouth every 8 (eight) hours as needed for moderate pain., Disp: , Rfl:    linaclotide  (LINZESS ) 290 MCG CAPS capsule, Take 1 capsule (290 mcg total) by mouth daily before breakfast., Disp: 90 capsule, Rfl: 3   omeprazole  (PRILOSEC) 20 MG capsule, Take 1 capsule (20 mg total) by mouth daily before breakfast., Disp: 30 capsule, Rfl: 5   ondansetron  (ZOFRAN ) 4 MG tablet, Take 1 tablet (4 mg total) by mouth every 8 (eight) hours as needed for nausea or vomiting., Disp: 30 tablet, Rfl: 3   polyethylene glycol powder (MIRALAX) 17 GM/SCOOP powder, Take 1 Container by mouth daily. Takes as needed, Disp: , Rfl:    Wheat Dextrin (BENEFIBER DRINK MIX PO), Take by mouth. Takes as needed, Disp: , Rfl:    chlorthalidone  (HYGROTON ) 25 MG tablet, Take 0.5 tablets (12.5 mg total) by mouth every morning., Disp: 45 tablet, Rfl: 3   losartan  (COZAAR ) 50 MG tablet, Take 1 tablet (50 mg total) by mouth daily., Disp: 90 tablet, Rfl: 3  Consent:   NA  Disposition:   As needed  Her questions and concerns were addressed to her satisfaction. She voices understanding of the recommendations provided  during this encounter.    Signed, Leslie Michele HAS, [facility]  A Division of [facility] [address], [place], [place] [address]  01/21/2024 6:52 PM

## 2024-01-21 NOTE — Patient Instructions (Signed)
 Medication Instructions:  Your physician recommends that you continue on your current medications as directed. Please refer to the Current Medication list given to you today.  *If you need a refill on your cardiac medications before your next appointment, please call your pharmacy*  Follow-Up: At [facility], you and your health needs are our priority.  As part of our continuing mission to provide you with exceptional heart care, our providers are all part of one team.  This team includes your primary Cardiologist (physician) and Advanced Practice Providers or APPs (Physician Assistants and Nurse Practitioners) who all work together to provide you with the care you need, when you need it.  Your next appointment:   As needed  Provider:   Madonna Large, DO  We recommend signing up for the patient portal called MyChart.  Sign up information is provided on this After Visit Summary.  MyChart is used to connect with patients for Virtual Visits (Telemedicine).  Patients are able to view lab/test results, encounter notes, upcoming appointments, etc.  Non-urgent messages can be sent to your provider as well.    To learn more about what you can do with MyChart, go to [email or web address].

## 2024-01-22 ENCOUNTER — Telehealth: Payer: Self-pay

## 2024-01-22 NOTE — Telephone Encounter (Signed)
 Pt phoned and LM on vm regarding eating applesauce and being able to add a protein powder to her broth.  (Mindy happened to walk iin so I asked her)  Advised the pt that she cannot do either of these things and she has to follow what is on the paperwork regarding colonoscopy prep. Pt expressed understanding.

## 2024-01-27 ENCOUNTER — Encounter (HOSPITAL_COMMUNITY): Payer: Self-pay | Admitting: Internal Medicine

## 2024-01-27 ENCOUNTER — Ambulatory Visit (HOSPITAL_COMMUNITY): Admitting: Anesthesiology

## 2024-01-27 ENCOUNTER — Ambulatory Visit (HOSPITAL_COMMUNITY)
Admission: RE | Admit: 2024-01-27 | Discharge: 2024-01-27 | Disposition: A | Discharge status: Home / Self Care | Attending: Internal Medicine | Admitting: Internal Medicine

## 2024-01-27 ENCOUNTER — Other Ambulatory Visit: Payer: Self-pay

## 2024-01-27 ENCOUNTER — Encounter (HOSPITAL_COMMUNITY)
Admission: RE | Disposition: A | Discharge status: Home / Self Care | Payer: Self-pay | Source: Home / Self Care | Attending: Internal Medicine

## 2024-01-27 DIAGNOSIS — K625 Hemorrhage of anus and rectum: Secondary | ICD-10-CM | POA: Insufficient documentation

## 2024-01-27 DIAGNOSIS — K642 Third degree hemorrhoids: Secondary | ICD-10-CM | POA: Diagnosis not present

## 2024-01-27 DIAGNOSIS — K649 Unspecified hemorrhoids: Secondary | ICD-10-CM

## 2024-01-27 DIAGNOSIS — K5730 Diverticulosis of large intestine without perforation or abscess without bleeding: Secondary | ICD-10-CM | POA: Insufficient documentation

## 2024-01-27 DIAGNOSIS — F418 Other specified anxiety disorders: Secondary | ICD-10-CM

## 2024-01-27 DIAGNOSIS — R194 Change in bowel habit: Secondary | ICD-10-CM | POA: Insufficient documentation

## 2024-01-27 DIAGNOSIS — I10 Essential (primary) hypertension: Secondary | ICD-10-CM | POA: Insufficient documentation

## 2024-01-27 DIAGNOSIS — Z79899 Other long term (current) drug therapy: Secondary | ICD-10-CM | POA: Diagnosis not present

## 2024-01-27 DIAGNOSIS — K644 Residual hemorrhoidal skin tags: Secondary | ICD-10-CM | POA: Diagnosis not present

## 2024-01-27 DIAGNOSIS — R7982 Elevated C-reactive protein (CRP): Secondary | ICD-10-CM | POA: Diagnosis not present

## 2024-01-27 DIAGNOSIS — K219 Gastro-esophageal reflux disease without esophagitis: Secondary | ICD-10-CM | POA: Insufficient documentation

## 2024-01-27 DIAGNOSIS — F419 Anxiety disorder, unspecified: Secondary | ICD-10-CM | POA: Diagnosis not present

## 2024-01-27 DIAGNOSIS — G4730 Sleep apnea, unspecified: Secondary | ICD-10-CM | POA: Insufficient documentation

## 2024-01-27 DIAGNOSIS — F32A Depression, unspecified: Secondary | ICD-10-CM | POA: Diagnosis not present

## 2024-01-27 DIAGNOSIS — K635 Polyp of colon: Secondary | ICD-10-CM | POA: Diagnosis not present

## 2024-01-27 HISTORY — PX: COLONOSCOPY: SHX5424

## 2024-01-27 HISTORY — DX: Sleep apnea, unspecified: G47.30

## 2024-01-27 SURGERY — COLONOSCOPY
Anesthesia: General

## 2024-01-27 MED ORDER — LACTATED RINGERS IV SOLN: INTRAVENOUS | Status: DC

## 2024-01-27 MED ORDER — PROPOFOL 10 MG/ML IV BOLUS
INTRAVENOUS | Status: DC | PRN
  Administered 2024-01-27: 100 mg via INTRAVENOUS

## 2024-01-27 MED ORDER — PROPOFOL 500 MG/50ML IV EMUL
INTRAVENOUS | Status: DC | PRN
Start: 2024-01-27 — End: 2024-01-27
  Administered 2024-01-27: 200 ug/kg/min via INTRAVENOUS

## 2024-01-27 NOTE — H&P (Signed)
 @LOGO @   Gastroenterology Progress Note    Primary Care Physician:  Ilah Crigler, MD Primary Gastroenterologist:  Dr. Shaaron  Pre-Procedure History & Physical: HPI:  Leslie Rodriguez is a 58 y.o. female here for  further valuation of intermittent rectal bleeding change in bowel habits elevated CRP and sed rate.   CTA abdomen negative for any evidence of significant mesenteric stenosis. Past Medical History:  Diagnosis Date   Anxiety    Constipation    likely IBS-C   Depression    GERD (gastroesophageal reflux disease)    Hypertension    Obesity    Sleep apnea     Past Surgical History:  Procedure Laterality Date   ABDOMINAL HYSTERECTOMY  2003   CESAREAN SECTION  1985   COLONOSCOPY  01/16/2006   RMR: normal rectum/left sided transverse diverticula   COLONOSCOPY N/A 09/21/2015   Dr. Shaaron: internal hemorrhoids, diverticulosis    COLONOSCOPY WITH PROPOFOL  N/A 03/31/2020   hemorrhoids, pancolonic diverticulosis, redundant colon.     Prior to Admission medications   Medication Sig Start Date End Date Taking? Authorizing Provider  acetaminophen  (TYLENOL ) 500 MG tablet Take 1,000 mg by mouth every 8 (eight) hours as needed for moderate pain.   Yes [provider]  chlorthalidone  (HYGROTON ) 25 MG tablet Take 0.5 tablets (12.5 mg total) by mouth every morning. 01/21/24  Yes Tolia, Sunit, DO  linaclotide  (LINZESS ) 290 MCG CAPS capsule Take 1 capsule (290 mcg total) by mouth daily before breakfast. 04/07/23  Yes Shirlean Therisa ORN, NP  losartan  (COZAAR ) 50 MG tablet Take 1 tablet (50 mg total) by mouth daily. 01/21/24  Yes Tolia, Sunit, DO  omeprazole  (PRILOSEC) 20 MG capsule Take 1 capsule (20 mg total) by mouth daily before breakfast. 07/18/20  Yes Rudy, Kristen S, PA-C  polyethylene glycol powder (MIRALAX) 17 GM/SCOOP powder Take 1 Container by mouth daily. Takes as needed   Yes [provider]  Wheat Dextrin (BENEFIBER DRINK MIX PO) Take by mouth. Takes as needed   Yes  [provider]  ondansetron  (ZOFRAN ) 4 MG tablet Take 1 tablet (4 mg total) by mouth every 8 (eight) hours as needed for nausea or vomiting. 02/27/23   Shirlean Therisa ORN, NP    Allergies as of 01/01/2024 - Review Complete 11/26/2023  Allergen Reaction Noted   Ciprofloxacin hcl Hives and Shortness Of Breath 07/27/2011    Family History  Problem Relation Age of Onset   Edema Mother    Colon cancer Neg Hx     Social History   Socioeconomic History   Marital status: Single    Spouse name: Not on file   Number of children: 4   Years of education: Not on file   Highest education level: Not on file  Occupational History   Not on file  Tobacco Use   Smoking status: Never   Smokeless tobacco: Never  Vaping Use   Vaping status: Never Used  Substance and Sexual Activity   Alcohol use: Yes    Alcohol/week: 3.0 standard drinks of alcohol    Types: 3 Glasses of wine per week    Comment: socially   Drug use: No   Sexual activity: Not Currently  Other Topics Concern   Not on file  Social History Narrative   Not on file   Social Drivers of Health   Financial Resource Strain: Not on file  Food Insecurity: Not on file  Transportation Needs: Not on file  Physical Activity: Not on file  Stress:  Not on file  Social Connections: Not on file  Intimate Partner Violence: Not on file    Review of Systems   See HPI, otherwise negative ROS  Physical Exam: BP 132/79   Pulse 79   Temp 98.2 F (36.8 C) (Oral)   Resp 20   SpO2 96%  General:   Alert,  Well-developed, well-nourished, pleasant and cooperative in NAD Lungs:  Clear throughout to auscultation.   No wheezes, crackles, or rhonchi. No acute distress. Heart:  Regular rate and rhythm; no murmurs, clicks, rubs,  or gallops. Abdomen: Non-distended, normal bowel sounds.  Soft and nontender without appreciable mass or hepatosplenomegaly.   Impression/Plan:     58 year old lady with intermittent rectal bleeding L elevated  inflammatory markers.  CTA abdomen negative for acute process or significant mesenteric stenosis.  Ileocolonoscopy today per plan.  The risks, benefits, limitations, alternatives and imponderables have been reviewed with the patient. Questions have been answered. All parties are agreeable.      Notice: This dictation was prepared with Dragon dictation along with smaller phrase technology. Any transcriptional errors that result from this process are unintentional and may not be corrected upon review.

## 2024-01-27 NOTE — Anesthesia Postprocedure Evaluation (Signed)
 Anesthesia Post Note  Patient: Leslie Rodriguez  Procedure(s) Performed: COLONOSCOPY  Patient location during evaluation: Phase II Anesthesia Type: General Level of consciousness: awake Pain management: pain level controlled Vital Signs Assessment: post-procedure vital signs reviewed and stable Respiratory status: spontaneous breathing and respiratory function stable Cardiovascular status: blood pressure returned to baseline and stable Postop Assessment: no headache and no apparent nausea or vomiting Anesthetic complications: no Comments: Late entry   No notable events documented.   Last Vitals:  Vitals:   01/27/24 1009 01/27/24 1125  BP: 132/79 (!) 132/92  Pulse: 79 84  Resp: 20 19  Temp: 36.8 C 36.6 C  SpO2: 96% 99%    Last Pain:  Vitals:   01/27/24 1125  TempSrc: Oral  PainSc: 0-No pain                 Yvonna JINNY Bosworth

## 2024-01-27 NOTE — Op Note (Signed)
 [facility] Patient Name: Leslie Rodriguez Procedure Date: 01/27/2024 10:49 AM MRN: [id number] Date of Birth: Jun 14, 1965 Attending MD: Lamar Ozell Hollingshead , MD, [id number] CSN: [id number] Age: 58 Admit Type: Outpatient Procedure:                Colonoscopy Indications:              Change in bowel habits; elevated inflammatory marker Providers:                Lamar Ozell Hollingshead, MD, Olam Ada, RN, Rosina Sprague Referring MD:              Medicines:                Propofol  per Anesthesia Complications:            No immediate complications. Estimated Blood Loss:     Estimated blood loss: none. Procedure:                Pre-Anesthesia Assessment:                           - Prior to the procedure, a History and Physical                            was performed, and patient medications and                            allergies were reviewed. The patient's tolerance of                            previous anesthesia was also reviewed. The risks                            and benefits of the procedure and the sedation                            options and risks were discussed with the patient.                            All questions were answered, and informed consent                            was obtained. Prior Anticoagulants: The patient has                            taken no anticoagulant or antiplatelet agents. ASA                            Grade Assessment: III - A patient with severe                            systemic disease. After reviewing the risks and  benefits, the patient was deemed in satisfactory                            condition to undergo the procedure.                           After obtaining informed consent, the colonoscope                            was passed under direct vision. Throughout the                            procedure, the patient's blood pressure, pulse, and                             oxygen saturations were monitored continuously. The                            [id number]-[id number] ([id number]) Colon was introduced through                            the anus and advanced to the 10 cm into the ileum.                            The colonoscopy was performed without difficulty.                            The patient tolerated the procedure well. The                            quality of the bowel preparation was adequate. The                            terminal ileum, ileocecal valve, appendiceal                            orifice, and rectum were photographed. Scope In: 11:06:34 AM Scope Out: 11:19:35 AM Scope Withdrawal Time: 0 hours 7 minutes 31 seconds  Total Procedure Duration: 0 hours 13 minutes 1 second  Findings:      Hemorrhoids were found on perianal exam.      Scattered medium-mouthed diverticula were found in the entire colon.      External and internal hemorrhoids were found during endoscopy. The       hemorrhoids were severe, large and Grade III (internal hemorrhoids that       prolapse but require manual reduction).      The exam was otherwise without abnormality on direct and retroflexion       views. Normal-appearing distal 10 cm TI. Impression:               - Hemorrhoids found on perianal exam.                           - Diverticulosis in the entire examined colon.                           -  External and internal hemorrhoids.                           - The examination was otherwise normal on direct                            and retroflexion views.                           - No specimens collected. Patient has been having                            some perirectal tenderness when she sits. Likely                            due to hemorrhoids. Moderate Sedation:      Moderate (conscious) sedation was personally administered by an       anesthesia professional. The following parameters were monitored: oxygen       saturation, heart rate, blood pressure,  respiratory rate, EKG, adequacy       of pulmonary ventilation, and response to care. Recommendation:           - Patient has a contact number available for                            emergencies. The signs and symptoms of potential                            delayed complications were discussed with the                            patient. Return to normal activities tomorrow.                            Written discharge instructions were provided to the                            patient.                           - Advance diet as tolerated.                           - Continue present medications.                           - Repeat colonoscopy in 10 years for screening                            purposes.                           - Return to GI office in 3 weeks. May be hemorrhoid                            banding candidate in the office. She would require  generous banding. Pamphlet on hemorrhoid banding                            provided to the patient today. Procedure Code(s):        --- Professional ---                           [phone], Colonoscopy, flexible; diagnostic, including                            collection of specimen(s) by brushing or washing,                            when performed (separate procedure) Diagnosis Code(s):        --- Professional ---                           K64.2, Third degree hemorrhoids                           R19.4, Change in bowel habit                           K57.30, Diverticulosis of large intestine without                            perforation or abscess without bleeding CPT copyright 2022 [employer]. All rights reserved. The codes documented in this report are preliminary and upon coder review may  be revised to meet current compliance requirements. [name], MD Lamar Ozell Hollingshead, MD 01/27/2024 11:31:34 AM This report has been signed electronically. Number of Addenda: 0

## 2024-01-27 NOTE — Transfer of Care (Signed)
 Immediate Anesthesia Transfer of Care Note  Patient: [name]  Procedure(s) Performed: COLONOSCOPY  Patient Location: Endoscopy Unit  Anesthesia Type:General  Level of Consciousness: awake, alert , oriented, and patient cooperative  Airway & Oxygen Therapy: Patient Spontanous Breathing  Post-op Assessment: Report given to RN, Post -op Vital signs reviewed and stable, and Patient moving all extremities X 4  Post vital signs: Reviewed and stable  Last Vitals:  Vitals Value Taken Time  BP    Temp    Pulse    Resp    SpO2      Last Pain:  Vitals:   01/27/24 1009  TempSrc: Oral  PainSc:       Patients Stated Pain Goal: 10 (01/27/24 0958)  Complications: No notable events documented.

## 2024-01-27 NOTE — Anesthesia Preprocedure Evaluation (Signed)
 Anesthesia Evaluation  Patient identified by MRN, date of birth, ID band Patient awake    Reviewed: Allergy & Precautions, H&P , NPO status , Patient's Chart, lab work & pertinent test results, reviewed documented beta blocker date and time   Airway Mallampati: II  TM Distance: >3 FB Neck ROM: full    Dental no notable dental hx.    Pulmonary neg pulmonary ROS, sleep apnea    Pulmonary exam normal breath sounds clear to auscultation       Cardiovascular Exercise Tolerance: Good hypertension, negative cardio ROS  Rhythm:regular Rate:Normal     Neuro/Psych  PSYCHIATRIC DISORDERS Anxiety Depression    negative neurological ROS  negative psych ROS   GI/Hepatic negative GI ROS, Neg liver ROS,GERD  ,,  Endo/Other  negative endocrine ROS    Renal/GU negative Renal ROS  negative genitourinary   Musculoskeletal   Abdominal   Peds  Hematology negative hematology ROS (+)   Anesthesia Other Findings   Reproductive/Obstetrics negative OB ROS                              Anesthesia Physical Anesthesia Plan  ASA: 2  Anesthesia Plan: General   Post-op Pain Management:    Induction:   PONV Risk Score and Plan: Propofol  infusion  Airway Management Planned:   Additional Equipment:   Intra-op Plan:   Post-operative Plan:   Informed Consent: I have reviewed the patients History and Physical, chart, labs and discussed the procedure including the risks, benefits and alternatives for the proposed anesthesia with the patient or authorized representative who has indicated his/her understanding and acceptance.     Dental Advisory Given  Plan Discussed with: CRNA  Anesthesia Plan Comments:         Anesthesia Quick Evaluation

## 2024-01-27 NOTE — OR Nursing (Signed)
 Leslie Rodriguez was at [facility] on 01/27/24 and cannot return to work until noon on 01/28/24.

## 2024-01-27 NOTE — Discharge Instructions (Addendum)
  Colonoscopy Discharge Instructions  Read the instructions outlined below and refer to this sheet in the next few weeks. These discharge instructions provide you with general information on caring for yourself after you leave the hospital. Your doctor may also give you specific instructions. While your treatment has been planned according to the most current medical practices available, unavoidable complications occasionally occur. If you have any problems or questions after discharge, call Dr. Shaaron at [phone]. ACTIVITY You may resume your regular activity, but move at a slower pace for the next 24 hours.  Take frequent rest periods for the next 24 hours.  Walking will help get rid of the air and reduce the bloated feeling in your belly (abdomen).  No driving for 24 hours (because of the medicine (anesthesia) used during the test).   Do not sign any important legal documents or operate any machinery for 24 hours (because of the anesthesia used during the test).  NUTRITION Drink plenty of fluids.  You may resume your normal diet as instructed by your doctor.  Begin with a light meal and progress to your normal diet. Heavy or fried foods are harder to digest and may make you feel sick to your stomach (nauseated).  Avoid alcoholic beverages for 24 hours or as instructed.  MEDICATIONS You may resume your normal medications unless your doctor tells you otherwise.  WHAT YOU CAN EXPECT TODAY Some feelings of bloating in the abdomen.  Passage of more gas than usual.  Spotting of blood in your stool or on the toilet paper.  IF YOU HAD POLYPS REMOVED DURING THE COLONOSCOPY: No aspirin products for 7 days or as instructed.  No alcohol for 7 days or as instructed.  Eat a soft diet for the next 24 hours.  FINDING OUT THE RESULTS OF YOUR TEST Not all test results are available during your visit. If your test results are not back during the visit, make an appointment with your caregiver to find out the  results. Do not assume everything is normal if you have not heard from your caregiver or the medical facility. It is important for you to follow up on all of your test results.  SEEK IMMEDIATE MEDICAL ATTENTION IF: You have more than a spotting of blood in your stool.  Your belly is swollen (abdominal distention).  You are nauseated or vomiting.  You have a temperature over 101.  You have abdominal pain or discomfort that is severe or gets worse throughout the day.     You have significant external hemorrhoids.   may be a cause of some of your discomfort when you sit down.   may be amenable to hemorrhoid banding in the office  pamphlet on hemorrhoid banding provided   Diverticulosis throughout your colon otherwise normal.   you should have a repeat colonoscopy in 10 years for screening   office visit with Therisa Stager in 3 to 4 weeks.  Office to notify you.

## 2024-01-28 ENCOUNTER — Encounter (HOSPITAL_COMMUNITY): Payer: Self-pay | Admitting: Internal Medicine

## 2024-02-27 ENCOUNTER — Ambulatory Visit: Admitting: Gastroenterology

## 2024-03-02 ENCOUNTER — Other Ambulatory Visit: Payer: Self-pay | Admitting: Cardiology

## 2024-03-02 DIAGNOSIS — I10 Essential (primary) hypertension: Secondary | ICD-10-CM

## 2024-04-06 ENCOUNTER — Other Ambulatory Visit: Payer: Self-pay | Admitting: Gastroenterology

## 2024-04-06 DIAGNOSIS — K581 Irritable bowel syndrome with constipation: Secondary | ICD-10-CM

## 2024-04-22 ENCOUNTER — Ambulatory Visit: Admitting: Gastroenterology

## 2024-06-18 ENCOUNTER — Ambulatory Visit: Admitting: Gastroenterology
